# Patient Record
Sex: Male | Born: 1955 | Race: White | Hispanic: No | Marital: Married | Smoking: Never smoker
Health system: Southern US, Community
[De-identification: ages and names within clinical notes are randomized; demographics above are authoritative.]

## PROBLEM LIST (undated history)

## (undated) DIAGNOSIS — I10 Essential (primary) hypertension: Secondary | ICD-10-CM

## (undated) DIAGNOSIS — Z87442 Personal history of urinary calculi: Secondary | ICD-10-CM

## (undated) DIAGNOSIS — E119 Type 2 diabetes mellitus without complications: Secondary | ICD-10-CM

---

## 2018-02-07 ENCOUNTER — Emergency Department (HOSPITAL_COMMUNITY): Payer: 59

## 2018-02-07 ENCOUNTER — Emergency Department (HOSPITAL_COMMUNITY)
Admission: EM | Admit: 2018-02-07 | Discharge: 2018-02-07 | Disposition: A | Discharge status: Home / Self Care | Payer: 59 | Attending: Emergency Medicine | Admitting: Emergency Medicine

## 2018-02-07 ENCOUNTER — Encounter (HOSPITAL_COMMUNITY): Payer: Self-pay | Admitting: Emergency Medicine

## 2018-02-07 DIAGNOSIS — I161 Hypertensive emergency: Secondary | ICD-10-CM | POA: Insufficient documentation

## 2018-02-07 DIAGNOSIS — N23 Unspecified renal colic: Secondary | ICD-10-CM | POA: Diagnosis not present

## 2018-02-07 DIAGNOSIS — E1169 Type 2 diabetes mellitus with other specified complication: Secondary | ICD-10-CM | POA: Diagnosis not present

## 2018-02-07 DIAGNOSIS — E118 Type 2 diabetes mellitus with unspecified complications: Secondary | ICD-10-CM

## 2018-02-07 DIAGNOSIS — R1110 Vomiting, unspecified: Secondary | ICD-10-CM | POA: Insufficient documentation

## 2018-02-07 DIAGNOSIS — I214 Non-ST elevation (NSTEMI) myocardial infarction: Secondary | ICD-10-CM | POA: Insufficient documentation

## 2018-02-07 DIAGNOSIS — M5489 Other dorsalgia: Secondary | ICD-10-CM | POA: Diagnosis present

## 2018-02-07 HISTORY — DX: Essential (primary) hypertension: I10

## 2018-02-07 LAB — TROPONIN I
Troponin I: 0.04 ng/mL (ref <0.03)
Troponin I: 0.04 ng/mL (ref <0.03)

## 2018-02-07 LAB — URINALYSIS, ROUTINE W REFLEX MICROSCOPIC
Bilirubin Urine: NEGATIVE
Glucose, UA: >=500 mg/dL — AB
KETONES UR: 15 mg/dL — AB
Leukocytes, UA: NEGATIVE
Nitrite: NEGATIVE
PH: 5 (ref 5.0–8.0)
Protein, ur: 30 mg/dL — AB
Specific Gravity, Urine: 1.025 (ref 1.005–1.030)

## 2018-02-07 LAB — COMPREHENSIVE METABOLIC PANEL
ALT: 9 U/L (ref 0–44)
ANION GAP: 14 (ref 5–15)
AST: 15 U/L (ref 15–41)
Albumin: 4.3 g/dL (ref 3.5–5.0)
Alkaline Phosphatase: 74 U/L (ref 38–126)
BILIRUBIN TOTAL: 0.8 mg/dL (ref 0.3–1.2)
BUN: 14 mg/dL (ref 8–23)
CO2: 22 mmol/L (ref 22–32)
Calcium: 9.3 mg/dL (ref 8.9–10.3)
Chloride: 104 mmol/L (ref 98–111)
Creatinine, Ser: 1.19 mg/dL (ref 0.61–1.24)
GFR calc Af Amer: >60 mL/min (ref >60)
Glucose, Bld: 451 mg/dL — ABNORMAL HIGH (ref 70–99)
POTASSIUM: 4.5 mmol/L (ref 3.5–5.1)
Sodium: 140 mmol/L (ref 135–145)
TOTAL PROTEIN: 7.7 g/dL (ref 6.5–8.1)

## 2018-02-07 LAB — CBC WITH DIFFERENTIAL/PLATELET
BASOS PCT: 0 %
Basophils Absolute: 0 10*3/uL (ref 0.0–0.1)
Eosinophils Absolute: 0 10*3/uL (ref 0.0–0.7)
Eosinophils Relative: 0 %
HEMATOCRIT: 45.9 % (ref 39.0–52.0)
Hemoglobin: 15.4 g/dL (ref 13.0–17.0)
Lymphocytes Relative: 6 %
Lymphs Abs: 0.6 10*3/uL — ABNORMAL LOW (ref 0.7–4.0)
MCH: 28.6 pg (ref 26.0–34.0)
MCHC: 33.6 g/dL (ref 30.0–36.0)
MCV: 85.3 fL (ref 78.0–100.0)
MONO ABS: 0.3 10*3/uL (ref 0.1–1.0)
MONOS PCT: 3 %
NEUTROS ABS: 9.9 10*3/uL — AB (ref 1.7–7.7)
Neutrophils Relative %: 91 %
Platelets: 258 10*3/uL (ref 150–400)
RBC: 5.38 MIL/uL (ref 4.22–5.81)
RDW: 13.7 % (ref 11.5–15.5)
WBC: 10.8 10*3/uL — AB (ref 4.0–10.5)

## 2018-02-07 LAB — URINALYSIS, MICROSCOPIC (REFLEX): BACTERIA UA: NONE SEEN

## 2018-02-07 LAB — CK TOTAL AND CKMB (NOT AT ARMC)
CK, MB: 3.8 ng/mL (ref 0.5–5.0)
Relative Index: INVALID (ref 0.0–2.5)
Total CK: 61 U/L (ref 49–397)

## 2018-02-07 MED ORDER — ASPIRIN 81 MG PO CHEW
324.0000 mg | CHEWABLE_TABLET | Freq: Once | ORAL | Status: AC
  Administered 2018-02-07: 324 mg via ORAL
  Filled 2018-02-07: qty 4

## 2018-02-07 MED ORDER — TAMSULOSIN HCL 0.4 MG PO CAPS
0.4000 mg | ORAL_CAPSULE | Freq: Once | ORAL | Status: AC
  Administered 2018-02-07: 0.4 mg via ORAL
  Filled 2018-02-07: qty 1

## 2018-02-07 MED ORDER — LABETALOL HCL 100 MG PO TABS
100.0000 mg | ORAL_TABLET | Freq: Once | ORAL | Status: AC
  Administered 2018-02-07: 100 mg via ORAL
  Filled 2018-02-07 (×2): qty 1

## 2018-02-07 MED ORDER — LABETALOL HCL 100 MG PO TABS: 100.0000 mg | ORAL_TABLET | Freq: Two times a day (BID) | ORAL | 0 refills | Status: DC

## 2018-02-07 MED ORDER — METFORMIN HCL 500 MG PO TABS
500.0000 mg | ORAL_TABLET | Freq: Once | ORAL | Status: AC
  Administered 2018-02-07: 500 mg via ORAL
  Filled 2018-02-07: qty 1

## 2018-02-07 MED ORDER — HYDROMORPHONE HCL 1 MG/ML IJ SOLN
1.0000 mg | Freq: Once | INTRAMUSCULAR | Status: AC
  Administered 2018-02-07: 1 mg via INTRAVENOUS
  Filled 2018-02-07: qty 1

## 2018-02-07 MED ORDER — OXYCODONE-ACETAMINOPHEN 5-325 MG PO TABS: 1.0000 | ORAL_TABLET | Freq: Four times a day (QID) | ORAL | 0 refills | Status: DC | PRN

## 2018-02-07 MED ORDER — FENTANYL CITRATE (PF) 100 MCG/2ML IJ SOLN
50.0000 ug | Freq: Once | INTRAMUSCULAR | Status: AC
  Administered 2018-02-07: 50 ug via INTRAVENOUS
  Filled 2018-02-07: qty 2

## 2018-02-07 MED ORDER — LACTATED RINGERS IV BOLUS
1000.0000 mL | Freq: Once | INTRAVENOUS | Status: AC
  Administered 2018-02-07: 1000 mL via INTRAVENOUS

## 2018-02-07 MED ORDER — METFORMIN HCL 500 MG PO TABS: 500.0000 mg | ORAL_TABLET | Freq: Two times a day (BID) | ORAL | 0 refills | Status: DC

## 2018-02-07 MED ORDER — ONDANSETRON HCL 4 MG PO TABS: 4.0000 mg | ORAL_TABLET | Freq: Three times a day (TID) | ORAL | 0 refills | Status: DC | PRN

## 2018-02-07 MED ORDER — SODIUM CHLORIDE 0.9 % IV BOLUS
1000.0000 mL | Freq: Once | INTRAVENOUS | Status: AC
  Administered 2018-02-07: 1000 mL via INTRAVENOUS

## 2018-02-07 MED ORDER — HYDROCODONE-ACETAMINOPHEN 5-325 MG PO TABS
1.0000 | ORAL_TABLET | Freq: Once | ORAL | Status: AC
  Administered 2018-02-07: 1 via ORAL
  Filled 2018-02-07: qty 1

## 2018-02-07 MED ORDER — KETOROLAC TROMETHAMINE 30 MG/ML IJ SOLN
15.0000 mg | Freq: Once | INTRAMUSCULAR | Status: AC
  Administered 2018-02-07: 15 mg via INTRAVENOUS
  Filled 2018-02-07: qty 1

## 2018-02-07 MED ORDER — TAMSULOSIN HCL 0.4 MG PO CAPS: 0.4000 mg | ORAL_CAPSULE | Freq: Every day | ORAL | 0 refills | Status: DC

## 2018-02-07 NOTE — ED Triage Notes (Signed)
Pt c/o back pain that started this morning. Reports vomiting once.

## 2018-02-07 NOTE — ED Provider Notes (Signed)
Emergency Department Provider Note   I have reviewed the triage vital signs and the nursing notes.   HISTORY  Chief Complaint Back Pain and Emesis   HPI Harry Rogers is a 62 y.o. male with history of hypertension the presents the emergency department today with back pain.  Patient had acute onset of left-sided back pain that is unlike anything he is ever had before.  Patient states that it does not really radiate but he has had some dark urine with it as well.  He cannot find a comfortable position.  States it does not get better or worse with movement.  No fevers.  Has has noticed a cough over the last 6 weeks or so that is nonproductive.  Patient knows he has high blood pressure but will not take medication for it.  Has not had trauma. No other associated or modifying symptoms.    Past Medical History:  Diagnosis Date  . Hypertension     There are no active problems to display for this patient.   History reviewed. No pertinent surgical history.    Allergies Patient has no known allergies.  No family history on file.  Social History Social History   Tobacco Use  . Smoking status: Never Smoker  . Smokeless tobacco: Never Used  Substance Use Topics  . Alcohol use: Not on file  . Drug use: Not on file    Review of Systems  All other systems negative except as documented in the HPI. All pertinent positives and negatives as reviewed in the HPI. ____________________________________________   PHYSICAL EXAM:  VITAL SIGNS: ED Triage Vitals  Enc Vitals Group     BP 02/07/18 1021 (!) 174/124     Pulse Rate 02/07/18 1021 98     Resp 02/07/18 1021 (!) 22     Temp --      Temp src --      SpO2 02/07/18 1021 98 %    Constitutional: Alert and oriented. Well appearing and in no acute distress. Eyes: Conjunctivae are normal. PERRL. EOMI. Head: Atraumatic. Nose: No congestion/rhinnorhea. Mouth/Throat: Mucous membranes are moist.  Oropharynx non-erythematous. Neck:  No stridor.  No meningeal signs.   Cardiovascular: Normal rate, regular rhythm. Good peripheral circulation. Grossly normal heart sounds.   Respiratory: Normal respiratory effort.  No retractions. Lungs CTAB. Gastrointestinal: Soft and nontender. No distention.  Musculoskeletal: No lower extremity tenderness nor edema. No gross deformities of extremities. Neurologic:  Normal speech and language. No gross focal neurologic deficits are appreciated.  Skin:  Skin is warm, dry and intact. No rash noted.   ____________________________________________   LABS (all labs ordered are listed, but only abnormal results are displayed)  Labs Reviewed  CBC WITH DIFFERENTIAL/PLATELET - Abnormal; Notable for the following components:      Result Value   WBC 10.8 (*)    Neutro Abs 9.9 (*)    Lymphs Abs 0.6 (*)    All other components within normal limits  URINALYSIS, ROUTINE W REFLEX MICROSCOPIC - Abnormal; Notable for the following components:   Color, Urine BROWN (*)    APPearance CLOUDY (*)    Glucose, UA >=500 (*)    Hgb urine dipstick LARGE (*)    Ketones, ur 15 (*)    Protein, ur 30 (*)    All other components within normal limits  COMPREHENSIVE METABOLIC PANEL - Abnormal; Notable for the following components:   Glucose, Bld 451 (*)    All other components within normal limits  TROPONIN  I - Abnormal; Notable for the following components:   Troponin I 0.04 (*)    All other components within normal limits  TROPONIN I - Abnormal; Notable for the following components:   Troponin I 0.04 (*)    All other components within normal limits  URINE CULTURE  URINALYSIS, MICROSCOPIC (REFLEX)  TROPONIN I  TROPONIN I  CK TOTAL AND CKMB ([facility])   ____________________________________________  EKG   EKG Interpretation  Date/Time:  Monday February 07 2018 11:36:39 EDT Ventricular Rate:  103 PR Interval:    QRS Duration: 118 QT Interval:  322 QTC Calculation: 422 R Axis:   -101 Text  Interpretation:  Sinus tachycardia Probable left atrial enlargement LAD, consider left anterior fascicular block Borderline low voltage, extremity leads Abnormal T, consider ischemia, lateral leads No old tracing to compare Confirmed by Marily MemosMesner, Kasondra Junod [phone]([id number]) on 02/07/2018 12:21:00 PM       ____________________________________________  RADIOLOGY  Dg Chest 2 View  Result Date: 02/07/2018 CLINICAL DATA:  Confusion, weakness, flank pain EXAM: CHEST - 2 VIEW COMPARISON:  None FINDINGS: Shallow lung inflation. Heart size is normal. Lungs are clear. No pulmonary edema. IMPRESSION: No active cardiopulmonary disease. Electronically Signed   By: Norva PavlovElizabeth  Brown M.D.   On: 02/07/2018 12:37   Ct Renal Stone Study  Result Date: 02/07/2018 CLINICAL DATA:  Back pain and vomiting EXAM: CT ABDOMEN AND PELVIS WITHOUT CONTRAST TECHNIQUE: Multidetector CT imaging of the abdomen and pelvis was performed following the standard protocol without IV contrast. COMPARISON:  None. FINDINGS: Lower chest: No acute abnormality. Hepatobiliary: No focal liver abnormality is seen. No gallstones, gallbladder wall thickening, or biliary dilatation. Pancreas: Unremarkable. No pancreatic ductal dilatation or surrounding inflammatory changes. Spleen: Normal in size without focal abnormality. Adrenals/Urinary Tract: Adrenal glands are within normal limits. The right kidney demonstrates scattered small nonobstructing renal stones. The right ureter and collecting system are within normal limits. On the left there are multiple left renal calculi identified both within the calyceal system as well as within the renal pelvis. The largest of these in the left renal pelvis measures 13 mm in dimension. There are hydronephrotic changes identified which extend into the left ureter and to the level of the left UVJ. At the left ureterovesical junction there is a 5 mm obstructing stone identified. The bladder is partially distended. Stomach/Bowel:  Diverticular change of the colon is noted without definitive diverticulitis. The appendix is within normal limits. No small bowel dilatation is seen. The stomach is within normal limits. Vascular/Lymphatic: Aortic atherosclerosis. No enlarged abdominal or pelvic lymph nodes. Reproductive: Prostate is unremarkable. Other: No abdominal wall hernia or abnormality. No abdominopelvic ascites. Musculoskeletal: Degenerative changes of lumbar spine. IMPRESSION: Distal left ureteral stone with obstructive change. Bilateral renal calculi are noted left greater than right as described above. Diverticulosis without diverticulitis. Electronically Signed   By: Alcide CleverMark  Lukens M.D.   On: 02/07/2018 12:26    ____________________________________________   PROCEDURES  Procedure(s) performed:   Procedures  CRITICAL CARE Performed by: Marily MemosJason Brielynn Sekula Total critical care time: 35 minutes Critical care time was exclusive of separately billable procedures and treating other patients. Critical care was necessary to treat or prevent imminent or life-threatening deterioration. Critical care was time spent personally by me on the following activities: development of treatment plan with patient and/or surrogate as well as nursing, discussions with consultants, evaluation of patient's response to treatment, examination of patient, obtaining history from patient or surrogate, ordering and performing treatments and interventions, ordering and review  of laboratory studies, ordering and review of radiographic studies, pulse oximetry and re-evaluation of patient's condition.  ____________________________________________   INITIAL IMPRESSION / ASSESSMENT AND PLAN / ED COURSE  Kidney stone versus pyelonephritis versus possible cardiac/pulmonary etiology.  Will work-up for same.  Pain medication fluids for now.  Patient found to be persistently hypertensive, tachycardic, tachypneic also found to have a kidney stone and elevated  troponin with abnormal EKG.  Discussed admitted to the hospital to get better blood pressure control and pain control and make sure that his cardiac enzymes are normal have cardiology see him however patient refused.  Patient is alert, oriented to person place time and situation.  He understands the risks of going home without definitively ensuring that this is not a primary cardiac problem however he still refuses.  He agrees to take some blood pressure medication and diabetes medications at home along with symptom medic treatment for his kidney stone.  Patient needs PCP follow-up but can return here for any change in mind or change in symptoms.  At the time of discharge patient was not necessarily stable but leaving AGAINST MEDICAL ADVICE.  Pertinent labs & imaging results that were available during my care of the patient were reviewed by me and considered in my medical decision making (see chart for details).  ____________________________________________  FINAL CLINICAL IMPRESSION(S) / ED DIAGNOSES  Final diagnoses:  Renal colic  Hypertensive emergency  NSTEMI (non-ST elevated myocardial infarction) (HCC)  Type 2 diabetes mellitus with complication, without long-term current use of insulin (HCC)     MEDICATIONS GIVEN DURING THIS VISIT:  Medications  metFORMIN (GLUCOPHAGE) tablet 500 mg (has no administration in time range)  lactated ringers bolus 1,000 mL (0 mLs Intravenous Stopped 02/07/18 1320)  fentaNYL (SUBLIMAZE) injection 50 mcg (50 mcg Intravenous Given 02/07/18 1129)  tamsulosin (FLOMAX) capsule 0.4 mg (0.4 mg Oral Given 02/07/18 1316)  ketorolac (TORADOL) 30 MG/ML injection 15 mg (15 mg Intravenous Given 02/07/18 1316)  HYDROcodone-acetaminophen (NORCO/VICODIN) 5-325 MG per tablet 1 tablet (1 tablet Oral Given 02/07/18 1316)  HYDROmorphone (DILAUDID) injection 1 mg (1 mg Intravenous Given 02/07/18 1424)  sodium chloride 0.9 % bolus 1,000 mL (1,000 mLs Intravenous New Bag/Given  02/07/18 1423)  labetalol (NORMODYNE) tablet 100 mg (100 mg Oral Given 02/07/18 1514)  aspirin chewable tablet 324 mg (324 mg Oral Given 02/07/18 1513)     NEW OUTPATIENT MEDICATIONS STARTED DURING THIS VISIT:  New Prescriptions   LABETALOL (NORMODYNE) 100 MG TABLET    Take 1 tablet (100 mg total) by mouth 2 (two) times daily.   METFORMIN (GLUCOPHAGE) 500 MG TABLET    Take 1 tablet (500 mg total) by mouth 2 (two) times daily with a meal.   ONDANSETRON (ZOFRAN) 4 MG TABLET    Take 1 tablet (4 mg total) by mouth every 8 (eight) hours as needed for nausea or vomiting.   OXYCODONE-ACETAMINOPHEN (PERCOCET) 5-325 MG TABLET    Take 1-2 tablets by mouth every 6 (six) hours as needed for severe pain.   TAMSULOSIN (FLOMAX) 0.4 MG CAPS CAPSULE    Take 1 capsule (0.4 mg total) by mouth daily.    Note:  This note was prepared with assistance of Dragon voice recognition software. Occasional wrong-word or sound-a-like substitutions may have occurred due to the inherent limitations of voice recognition software.   Marily Memos, MD 02/07/18 [phone]

## 2018-02-07 NOTE — ED Notes (Signed)
Date and time results received: 02/07/18 1328  Test: Troponin  Critical Value: 0.04  Name of Provider Notified: Dr. Desiree LucyJ. Messner   Orders Received? Or Actions Taken?: Will continue to monitor and await for new orders.

## 2018-02-07 NOTE — ED Notes (Signed)
Date and time results received: 02/07/18 16:03  Test: Troponin  Critical Value: 0.04  Name of Provider Notified: Dr. Carolynn ServeJ. Mesner   Orders Received? Or Actions Taken?: Will continue to monitor and await any new orders.

## 2018-02-07 NOTE — ED Notes (Signed)
Bed: WA07 Expected date:  Expected time:  Means of arrival:  Comments: 

## 2018-02-08 LAB — URINE CULTURE: Culture: NO GROWTH

## 2018-02-23 ENCOUNTER — Ambulatory Visit (INDEPENDENT_AMBULATORY_CARE_PROVIDER_SITE_OTHER): Payer: Managed Care, Other (non HMO) | Admitting: Family Medicine

## 2018-02-23 ENCOUNTER — Encounter: Payer: Self-pay | Admitting: Family Medicine

## 2018-02-23 ENCOUNTER — Other Ambulatory Visit: Payer: Self-pay

## 2018-02-23 VITALS — BP 162/90 | HR 87 | Temp 98.1°F | Ht 64.0 in | Wt 188.0 lb

## 2018-02-23 DIAGNOSIS — R778 Other specified abnormalities of plasma proteins: Secondary | ICD-10-CM

## 2018-02-23 DIAGNOSIS — R739 Hyperglycemia, unspecified: Secondary | ICD-10-CM

## 2018-02-23 DIAGNOSIS — Z91199 Patient's noncompliance with other medical treatment and regimen due to unspecified reason: Secondary | ICD-10-CM

## 2018-02-23 DIAGNOSIS — Z9119 Patient's noncompliance with other medical treatment and regimen: Secondary | ICD-10-CM

## 2018-02-23 DIAGNOSIS — I10 Essential (primary) hypertension: Secondary | ICD-10-CM | POA: Diagnosis not present

## 2018-02-23 DIAGNOSIS — N200 Calculus of kidney: Secondary | ICD-10-CM

## 2018-02-23 DIAGNOSIS — R7989 Other specified abnormal findings of blood chemistry: Secondary | ICD-10-CM | POA: Diagnosis not present

## 2018-02-23 DIAGNOSIS — Z13220 Encounter for screening for lipoid disorders: Secondary | ICD-10-CM

## 2018-02-23 DIAGNOSIS — Z23 Encounter for immunization: Secondary | ICD-10-CM

## 2018-02-23 MED ORDER — LABETALOL HCL 100 MG PO TABS: 100.0000 mg | ORAL_TABLET | Freq: Two times a day (BID) | ORAL | 1 refills | Status: DC

## 2018-02-23 MED ORDER — LISINOPRIL 10 MG PO TABS: 10.0000 mg | ORAL_TABLET | Freq: Every day | ORAL | 1 refills | Status: DC

## 2018-02-23 MED ORDER — METFORMIN HCL 500 MG PO TABS: 500.0000 mg | ORAL_TABLET | Freq: Two times a day (BID) | ORAL | 1 refills | Status: DC

## 2018-02-23 NOTE — Progress Notes (Signed)
Subjective:  By signing my name below, I, Essence Howell, attest that this documentation has been prepared under the direction and in the presence of Wendie Agreste, MD Electronically Signed: Ladene Artist, ED Scribe 02/23/2018 at 3:37 PM.   Patient ID: Paris Lore, male    DOB: 03/24/1956, 61 y.o.   MRN: [id number]  Chief Complaint  Patient presents with  . Establish Care    Blood sugar and get to know each other    HPI  Octavius Shin "[name]" is a 62 y.o. male who presents to Primary Care at [facility] to establish care. New pt. H/o DM, HTN, gout, psoriasis. Pt is fasting at this time.  Gout Seen at [facility] rheumatology. Prev on allopurinol 200 mg qd. Plan for 300 mg allopurinol based on note from 12/27/13. Denies alcohol use. Reports occasional marijuana use but no other illicit drug use.  HTN Seen 9/23 in ER, noted HTN at 174/124 but wasn't taking meds for BP. Tachycardiac, HTN and tachypneic, elevated troponin at 0.04 with abnormal EKG. Recommended hospital admission and cardiology eval but he refused and left AMA. Treated with labetalol 100 mg bid. Glucose uncontrolled with reading of 451 on 9/23, started metformin 500 mg bid, creatinine 1.19. Treated for kidney stone at that time. CT renal stone study with 5 mm stone in distal ureter and L renal calculi barges 1.3 cm in renal pelvis on L. - Pt states that he has been feeling fine overall since ER visit. He has never seen an urologist or general physical in ~10 yrs. States he does not recall actually seeing the kidney stone but feels like he passed it. Pain and hematuria have improved and pt does not recall if he is still taking Flomax. He reports that he promised that he would do f/u so they agreed to let him leave. He has noticed feeling lethargic which he thinks is due to BP meds. States he has taken Losartan in the past but stopped taking meds when he ran out of refills. Denies cp. Pt has never seen cardiology or had a stress test. No known  drug allergies.  DM Pt reports that he was diagnosed with DM in the ER. He has been compliant with Metformin; denies freq urination, polydipsia, blurred vision, any side-effects from meds. Wife states that he has been eating healthier since ER visit.  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  There are no active problems to display for this patient.  Past Medical History:  Diagnosis Date  . Hypertension    No past surgical history on file. No Known Allergies Prior to Admission medications   Medication Sig Start Date End Date Taking? Authorizing Provider  acetaminophen (TYLENOL) 500 MG tablet Take 1,000 mg by mouth 2 (two) times daily as needed for moderate pain.    [provider]  DM-Doxylamine-Acetaminophen 30-12.09-998 MG/30ML LIQD Take 30 mLs by mouth daily as needed (sleep).    [provider]  hydrocortisone cream 1 % Apply 1 application topically daily as needed for itching (psoriasis).    [provider]  ibuprofen (ADVIL,MOTRIN) 200 MG tablet Take 400 mg by mouth every 4 (four) hours as needed for moderate pain.    [provider]  labetalol (NORMODYNE) 100 MG tablet Take 1 tablet (100 mg total) by mouth 2 (two) times daily. 02/07/18   Mesner, Corene Cornea, MD  metFORMIN (GLUCOPHAGE) 500 MG tablet Take 1 tablet (500 mg total) by mouth 2 (two)  times daily with a meal. 02/07/18   Mesner, Corene Cornea, MD  ondansetron (ZOFRAN) 4 MG tablet Take 1 tablet (4 mg total) by mouth every 8 (eight) hours as needed for nausea or vomiting. 02/07/18   Mesner, Corene Cornea, MD  oxyCODONE-acetaminophen (PERCOCET) 5-325 MG tablet Take 1-2 tablets by mouth every 6 (six) hours as needed for severe pain. 02/07/18   Mesner, Corene Cornea, MD  tamsulosin (FLOMAX) 0.4 MG CAPS capsule Take 1 capsule (0.4 mg total) by mouth daily. 02/07/18   Mesner, Corene Cornea, MD   Social History   Socioeconomic History  . Marital status: Married    Spouse name: Not on file  .  Number of children: Not on file  . Years of education: Not on file  . Highest education level: Not on file  Occupational History  . Not on file  Social Needs  . Financial resource strain: Not on file  . Food insecurity:    Worry: Not on file    Inability: Not on file  . Transportation needs:    Medical: Not on file    Non-medical: Not on file  Tobacco Use  . Smoking status: Never Smoker  . Smokeless tobacco: Never Used  Substance and Sexual Activity  . Alcohol use: Not on file  . Drug use: Not on file  . Sexual activity: Not on file  Lifestyle  . Physical activity:    Days per week: Not on file    Minutes per session: Not on file  . Stress: Not on file  Relationships  . Social connections:    Talks on phone: Not on file    Gets together: Not on file    Attends religious service: Not on file    Active member of club or organization: Not on file    Attends meetings of clubs or organizations: Not on file    Relationship status: Not on file  . Intimate partner violence:    Fear of current or ex partner: Not on file    Emotionally abused: Not on file    Physically abused: Not on file    Forced sexual activity: Not on file  Other Topics Concern  . Not on file  Social History Narrative  . Not on file   Review of Systems  Constitutional: Positive for fatigue.  Eyes: Negative for visual disturbance.  Cardiovascular: Negative for chest pain.  Gastrointestinal: Negative for abdominal pain (resolved).  Endocrine: Negative for polydipsia.  Genitourinary: Negative for flank pain, frequency and hematuria.      Objective:   Physical Exam  Constitutional: He is oriented to person, place, and time. He appears well-developed and well-nourished.  HENT:  Head: Normocephalic and atraumatic.  Eyes: Pupils are equal, round, and reactive to light. EOM are normal.  Neck: No JVD present. Carotid bruit is not present.  Cardiovascular: Normal rate, regular rhythm and normal heart  sounds.  No murmur heard. Pulmonary/Chest: Effort normal and breath sounds normal. He has no rales.  Musculoskeletal: He exhibits no edema.  Neurological: He is alert and oriented to person, place, and time.  Skin: Skin is warm and dry.  Psychiatric: He has a normal mood and affect.  Vitals reviewed.  Vitals:   02/23/18 1515 02/23/18 1518  BP: (!) 172/106 (!) 162/90  Pulse: 87   Temp: 98.1 F (36.7 C)   TempSrc: Oral   SpO2: 99%   Weight: 188 lb (85.3 kg)   Height: 5' 4"  (1.626 m)       Assessment &  Plan:    Sade Mehlhoff is a 62 y.o. male Essential hypertension - Plan: Ambulatory referral to Cardiology, Comprehensive metabolic panel, Lipid panel, Microalbumin/Creatinine Ratio, Urine, lisinopril (PRINIVIL,ZESTRIL) 10 MG tablet, labetalol (NORMODYNE) 100 MG tablet Elevated troponin - Plan: Ambulatory referral to Cardiology History of nonadherence to medical treatment  -Importance of medication adherence discussed as well as risks of nonadherence discussed.  -Continue labetalol, add lisinopril 10 mg daily.  -Elevated troponin may have been potential strain pattern, asymptomatic at present other than some fatigue.  Refer to cardiology for ongoing follow-up and decision on further testing/stress testing.  -ER/911 precautions given for any chest pain or worsening symptoms  Screening for hyperlipidemia   - check lipid panel.   Hyperglycemia - Plan: Hemoglobin A1c, Comprehensive metabolic panel, Microalbumin/Creatinine Ratio, Urine, metFORMIN (GLUCOPHAGE) 500 MG tablet  -Diabetes level of hyperglycemia in the hospital, check A1c, urine micro/creatinine, CMP, start metformin 500 mg twice daily.  Nephrolithiasis - Plan: Ambulatory referral to Urology  -No current symptoms, possibly past previous nephrolith.  Other nephroliths noted, refer to urology for evaluation and ongoing care.  Need for immunization against influenza - Plan: Flu Vaccine QUAD 36+ mos IM  Recheck next few weeks  to review labs and decide next step in treatment  Meds ordered this encounter  Medications  . lisinopril (PRINIVIL,ZESTRIL) 10 MG tablet    Sig: Take 1 tablet (10 mg total) by mouth daily.    Dispense:  90 tablet    Refill:  1  . labetalol (NORMODYNE) 100 MG tablet    Sig: Take 1 tablet (100 mg total) by mouth 2 (two) times daily.    Dispense:  60 tablet    Refill:  1  . metFORMIN (GLUCOPHAGE) 500 MG tablet    Sig: Take 1 tablet (500 mg total) by mouth 2 (two) times daily with a meal.    Dispense:  60 tablet    Refill:  1   Patient Instructions   If no more acute symptoms of kidney stone, you can stop the tamsulosin.  I will refer you to urology to discuss other kidney stones. Return to the clinic or go to the nearest emergency room if any of your symptoms worsen or new symptoms occur.  I will also refer you to cardiology due to prior elevated troponin when in the emergency room.for blood pressure, continue labetalol, but I would also recommend starting additional med. Lisinopril 53m per day for now. If any chest pains, call 911/go to the emergency room.  Blood sugar high in hospital at diabetes level. Continue metformin twice per day, I will check A1c to decide on other treatment, and follow up in next few weeks to discuss further.   Type 2 Diabetes Mellitus, Self Care, Adult When you have type 2 diabetes (type 2 diabetes mellitus), you must keep your blood sugar (glucose) under control. You can do this with:  Nutrition.  Exercise.  Lifestyle changes.  Medicines or insulin, if needed.  Support from your doctors and others.  How do I manage my blood sugar?  Check your blood sugar level every day, as often as told.  Call your doctor if your blood sugar is above your goal numbers for 2 tests in a row.  Have your A1c (hemoglobin A1c) level checked at least two times a year. Have it checked more often if your doctor tells you to. Your doctor will set treatment goals for  you. Generally, you should have these blood sugar levels:  Before meals (preprandial):  80-130 mg/dL (4.4-7.2 mmol/L).  After meals (postprandial): lower than 180 mg/dL (10 mmol/L).  A1c level: less than 7%.  What do I need to know about high blood sugar? High blood sugar is called hyperglycemia. Know the signs of high blood sugar. Signs may include:  Feeling: ? Thirsty. ? Hungry. ? Very tired.  Needing to pee (urinate) more than usual.  Blurry vision.  What do I need to know about low blood sugar? Low blood sugar is called hypoglycemia. This is when blood sugar is at or below 70 mg/dL (3.9 mmol/L). Symptoms may include:  Feeling: ? Hungry. ? Worried or nervous (anxious). ? Sweaty and clammy. ? Confused. ? Dizzy. ? Sleepy. ? Sick to your stomach (nauseous).  Having: ? A fast heartbeat (palpitations). ? A headache. ? A change in your vision. ? Jerky movements that you cannot control (seizure). ? Nightmares. ? Tingling or no feeling (numbness) around the mouth, lips, or tongue.  Having trouble with: ? Talking. ? Paying attention (concentrating). ? Moving (coordination). ? Sleeping.  Shaking.  Passing out (fainting).  Getting upset easily (irritability).  Treating low blood sugar  To treat low blood sugar, eat or drink something sugary right away. If you can think clearly and swallow safely, follow the 15:15 rule:  Take 15 grams of a fast-acting carb (carbohydrate). Some fast-acting carbs are: ? 1 tube of glucose gel. ? 3 sugar tablets (glucose pills). ? 6-8 pieces of hard candy. ? 4 oz (120 mL) of fruit juice. ? 4 oz (120 mL) regular (not diet) soda.  Check your blood sugar 15 minutes after you take the carb.  If your blood sugar is still at or below 70 mg/dL (3.9 mmol/L), take 15 grams of a carb again.  If your blood sugar does not go above 70 mg/dL (3.9 mmol/L) after 3 tries, get help right away.  After your blood sugar goes back to normal, eat a  meal or a snack within 1 hour.  Treating very low blood sugar If your blood sugar is at or below 54 mg/dL (3 mmol/L), you have very low blood sugar (severe hypoglycemia). This is an emergency. Do not wait to see if the symptoms will go away. Get medical help right away. Call your local emergency services (911 in the [place] Do not drive yourself to the hospital. If you have very low blood sugar and you cannot eat or drink, you may need a glucagon shot (injection). A family member or friend should learn how to check your blood sugar and how to give you a glucagon shot. Ask your doctor if you need to have a glucagon shot kit at home. What else is important to manage my diabetes? Medicine Follow these instructions about insulin and diabetes medicines:  Take them as told by your doctor.  Adjust them as told by your doctor.  Do not run out of them.  Having diabetes can raise your risk for other long-term conditions. These include heart or kidney disease. Your doctor may prescribe medicines to help prevent problems from diabetes. Food   Make healthy food choices. These include: ? Chicken, fish, egg whites, and beans. ? Oats, whole wheat, bulgur, brown rice, quinoa, and millet. ? Fresh fruits and vegetables. ? Low-fat dairy products. ? Nuts, avocado, olive oil, and canola oil.  Make a food plan with a specialist (dietitian).  Follow instructions from your doctor about what you cannot eat or drink.  Drink enough fluid to keep your pee (urine) clear or  pale yellow.  Eat healthy snacks between healthy meals.  Keep track of carbs that you eat. Read food labels. Learn food serving sizes.  Follow your sick day plan when you cannot eat or drink normally. Make this plan with your doctor so it is ready to use. Activity  Exercise at least 3 times a week.  Do not go more than 2 days without exercising.  Talk with your doctor before you start a new exercise. Your doctor may need to adjust your  insulin, medicines, or food. Lifestyle   Do not use any tobacco products. These include cigarettes, chewing tobacco, and e-cigarettes.If you need help quitting, ask your doctor.  Ask your doctor how much alcohol is safe for you.  Learn to deal with stress. If you need help with this, ask your doctor. Body care  Stay up to date with your shots (immunizations).  Have your eyes and feet checked by a doctor as often as told.  Check your skin and feet every day. Check for cuts, bruises, redness, blisters, or sores.  Brush your teeth and gums two times a day.  Floss at least one time a day.  Go to the dentist least one time every 6 months.  Stay at a healthy weight. General instructions   Take over-the-counter and prescription medicines only as told by your doctor.  Share your diabetes care plan with: ? Your work or school. ? People you live with.  Check your pee (urine) for ketones: ? When you are sick. ? As told by your doctor.  Carry a card or wear jewelry that says that you have diabetes.  Ask your doctor: ? Do I need to meet with a diabetes educator? ? Where can I find a support group for people with diabetes?  Keep all follow-up visits as told by your doctor. This is important. Where to find more information: To learn more about diabetes, visit:  American Diabetes Association: www.diabetes.org  American Association of Diabetes Educators: www.diabeteseducator.org/patient-resources  This information is not intended to replace advice given to you by your health care provider. Make sure you discuss any questions you have with your health care provider. Document Released: 08/26/2015 Document Revised: 10/10/2015 Document Reviewed: 06/07/2015 Elsevier Interactive Patient Education  [employer].  If you have lab work done today you will be contacted with your lab results within the next 2 weeks.  If you have not heard from [place] then please contact us. The fastest  way to get your results is to register for My Chart.   IF you received an x-ray today, you will receive an invoice from [facility]. Please contact [facility] at [phone] with questions or concerns regarding your invoice.   IF you received labwork today, you will receive an invoice from [facility]. Please contact [facility] at [phone] with questions or concerns regarding your invoice.   Our billing staff will not be able to assist you with questions regarding bills from these companies.  You will be contacted with the lab results as soon as they are available. The fastest way to get your results is to activate your My Chart account. Instructions are located on the last page of this paperwork. If you have not heard from [place] regarding the results in 2 weeks, please contact this office.       I personally performed the services described in this documentation, which was scribed in my presence. The recorded information has been reviewed and considered for accuracy and completeness, addended by  me as needed, and agree with information above.  Signed,   Merri Ray, MD [facility] at [facility].  02/27/18 4:38 PM

## 2018-02-23 NOTE — Patient Instructions (Addendum)
If no more acute symptoms of kidney stone, you can stop the tamsulosin.  I will refer you to urology to discuss other kidney stones. Return to the clinic or go to the nearest emergency room if any of your symptoms worsen or new symptoms occur.  I will also refer you to cardiology due to prior elevated troponin when in the emergency room.for blood pressure, continue labetalol, but I would also recommend starting additional med. Lisinopril 28m per day for now. If any chest pains, call 911/go to the emergency room.  Blood sugar high in hospital at diabetes level. Continue metformin twice per day, I will check A1c to decide on other treatment, and follow up in next few weeks to discuss further.   Type 2 Diabetes Mellitus, Self Care, Adult When you have type 2 diabetes (type 2 diabetes mellitus), you must keep your blood sugar (glucose) under control. You can do this with:  Nutrition.  Exercise.  Lifestyle changes.  Medicines or insulin, if needed.  Support from your doctors and others.  How do I manage my blood sugar?  Check your blood sugar level every day, as often as told.  Call your doctor if your blood sugar is above your goal numbers for 2 tests in a row.  Have your A1c (hemoglobin A1c) level checked at least two times a year. Have it checked more often if your doctor tells you to. Your doctor will set treatment goals for you. Generally, you should have these blood sugar levels:  Before meals (preprandial): 80-130 mg/dL (4.4-7.2 mmol/L).  After meals (postprandial): lower than 180 mg/dL (10 mmol/L).  A1c level: less than 7%.  What do I need to know about high blood sugar? High blood sugar is called hyperglycemia. Know the signs of high blood sugar. Signs may include:  Feeling: ? Thirsty. ? Hungry. ? Very tired.  Needing to pee (urinate) more than usual.  Blurry vision.  What do I need to know about low blood sugar? Low blood sugar is called hypoglycemia. This is  when blood sugar is at or below 70 mg/dL (3.9 mmol/L). Symptoms may include:  Feeling: ? Hungry. ? Worried or nervous (anxious). ? Sweaty and clammy. ? Confused. ? Dizzy. ? Sleepy. ? Sick to your stomach (nauseous).  Having: ? A fast heartbeat (palpitations). ? A headache. ? A change in your vision. ? Jerky movements that you cannot control (seizure). ? Nightmares. ? Tingling or no feeling (numbness) around the mouth, lips, or tongue.  Having trouble with: ? Talking. ? Paying attention (concentrating). ? Moving (coordination). ? Sleeping.  Shaking.  Passing out (fainting).  Getting upset easily (irritability).  Treating low blood sugar  To treat low blood sugar, eat or drink something sugary right away. If you can think clearly and swallow safely, follow the 15:15 rule:  Take 15 grams of a fast-acting carb (carbohydrate). Some fast-acting carbs are: ? 1 tube of glucose gel. ? 3 sugar tablets (glucose pills). ? 6-8 pieces of hard candy. ? 4 oz (120 mL) of fruit juice. ? 4 oz (120 mL) regular (not diet) soda.  Check your blood sugar 15 minutes after you take the carb.  If your blood sugar is still at or below 70 mg/dL (3.9 mmol/L), take 15 grams of a carb again.  If your blood sugar does not go above 70 mg/dL (3.9 mmol/L) after 3 tries, get help right away.  After your blood sugar goes back to normal, eat a meal or a snack within 1  hour.  Treating very low blood sugar If your blood sugar is at or below 54 mg/dL (3 mmol/L), you have very low blood sugar (severe hypoglycemia). This is an emergency. Do not wait to see if the symptoms will go away. Get medical help right away. Call your local emergency services (911 in the [place] Do not drive yourself to the hospital. If you have very low blood sugar and you cannot eat or drink, you may need a glucagon shot (injection). A family member or friend should learn how to check your blood sugar and how to give you a glucagon  shot. Ask your doctor if you need to have a glucagon shot kit at home. What else is important to manage my diabetes? Medicine Follow these instructions about insulin and diabetes medicines:  Take them as told by your doctor.  Adjust them as told by your doctor.  Do not run out of them.  Having diabetes can raise your risk for other long-term conditions. These include heart or kidney disease. Your doctor may prescribe medicines to help prevent problems from diabetes. Food   Make healthy food choices. These include: ? Chicken, fish, egg whites, and beans. ? Oats, whole wheat, bulgur, brown rice, quinoa, and millet. ? Fresh fruits and vegetables. ? Low-fat dairy products. ? Nuts, avocado, olive oil, and canola oil.  Make a food plan with a specialist (dietitian).  Follow instructions from your doctor about what you cannot eat or drink.  Drink enough fluid to keep your pee (urine) clear or pale yellow.  Eat healthy snacks between healthy meals.  Keep track of carbs that you eat. Read food labels. Learn food serving sizes.  Follow your sick day plan when you cannot eat or drink normally. Make this plan with your doctor so it is ready to use. Activity  Exercise at least 3 times a week.  Do not go more than 2 days without exercising.  Talk with your doctor before you start a new exercise. Your doctor may need to adjust your insulin, medicines, or food. Lifestyle   Do not use any tobacco products. These include cigarettes, chewing tobacco, and e-cigarettes.If you need help quitting, ask your doctor.  Ask your doctor how much alcohol is safe for you.  Learn to deal with stress. If you need help with this, ask your doctor. Body care  Stay up to date with your shots (immunizations).  Have your eyes and feet checked by a doctor as often as told.  Check your skin and feet every day. Check for cuts, bruises, redness, blisters, or sores.  Brush your teeth and gums two times a  day.  Floss at least one time a day.  Go to the dentist least one time every 6 months.  Stay at a healthy weight. General instructions   Take over-the-counter and prescription medicines only as told by your doctor.  Share your diabetes care plan with: ? Your work or school. ? People you live with.  Check your pee (urine) for ketones: ? When you are sick. ? As told by your doctor.  Carry a card or wear jewelry that says that you have diabetes.  Ask your doctor: ? Do I need to meet with a diabetes educator? ? Where can I find a support group for people with diabetes?  Keep all follow-up visits as told by your doctor. This is important. Where to find more information: To learn more about diabetes, visit:  American Diabetes Association: www.diabetes.org  American Association  of Diabetes Educators: www.diabeteseducator.org/patient-resources  This information is not intended to replace advice given to you by your health care provider. Make sure you discuss any questions you have with your health care provider. Document Released: 08/26/2015 Document Revised: 10/10/2015 Document Reviewed: 06/07/2015 Elsevier Interactive Patient Education  [employer].  If you have lab work done today you will be contacted with your lab results within the next 2 weeks.  If you have not heard from [place] then please contact us. The fastest way to get your results is to register for My Chart.   IF you received an x-ray today, you will receive an invoice from [facility]. Please contact [facility] at [phone] with questions or concerns regarding your invoice.   IF you received labwork today, you will receive an invoice from [facility]. Please contact [facility] at [phone] with questions or concerns regarding your invoice.   Our billing staff will not be able to assist you with questions regarding bills from these companies.  You will be contacted with the lab results as  soon as they are available. The fastest way to get your results is to activate your My Chart account. Instructions are located on the last page of this paperwork. If you have not heard from [place] regarding the results in 2 weeks, please contact this office.

## 2018-02-24 ENCOUNTER — Other Ambulatory Visit: Payer: Self-pay | Admitting: Family Medicine

## 2018-02-24 DIAGNOSIS — E878 Other disorders of electrolyte and fluid balance, not elsewhere classified: Secondary | ICD-10-CM

## 2018-02-24 LAB — COMPREHENSIVE METABOLIC PANEL
A/G RATIO: 1.8 (ref 1.2–2.2)
ALT: 8 IU/L (ref 0–44)
AST: 16 IU/L (ref 0–40)
Albumin: 4.8 g/dL (ref 3.6–4.8)
Alkaline Phosphatase: 68 IU/L (ref 39–117)
BUN/Creatinine Ratio: 15 (ref 10–24)
BUN: 13 mg/dL (ref 8–27)
Bilirubin Total: 0.4 mg/dL (ref 0.0–1.2)
CHLORIDE: 108 mmol/L — AB (ref 96–106)
CO2: 13 mmol/L — CL (ref 20–29)
Calcium: 10.4 mg/dL — ABNORMAL HIGH (ref 8.6–10.2)
Creatinine, Ser: 0.86 mg/dL (ref 0.76–1.27)
GFR calc Af Amer: 107 mL/min/{1.73_m2} (ref >59)
GFR, EST NON AFRICAN AMERICAN: 92 mL/min/{1.73_m2} (ref >59)
Globulin, Total: 2.7 g/dL (ref 1.5–4.5)
Glucose: 124 mg/dL — ABNORMAL HIGH (ref 65–99)
POTASSIUM: 5 mmol/L (ref 3.5–5.2)
Sodium: 147 mmol/L — ABNORMAL HIGH (ref 134–144)
Total Protein: 7.5 g/dL (ref 6.0–8.5)

## 2018-02-24 LAB — HEMOGLOBIN A1C
Est. average glucose Bld gHb Est-mCnc: 214 mg/dL
Hgb A1c MFr Bld: 9.1 % — ABNORMAL HIGH (ref 4.8–5.6)

## 2018-02-24 LAB — LIPID PANEL
CHOL/HDL RATIO: 4.8 ratio (ref 0.0–5.0)
Cholesterol, Total: 197 mg/dL (ref 100–199)
HDL: 41 mg/dL (ref >39)
LDL Calculated: 126 mg/dL — ABNORMAL HIGH (ref 0–99)
TRIGLYCERIDES: 150 mg/dL — AB (ref 0–149)
VLDL CHOLESTEROL CAL: 30 mg/dL (ref 5–40)

## 2018-02-24 LAB — MICROALBUMIN / CREATININE URINE RATIO
Creatinine, Urine: 92.7 mg/dL
Microalb/Creat Ratio: 38 mg/g creat — ABNORMAL HIGH (ref 0.0–30.0)
Microalbumin, Urine: 35.2 ug/mL

## 2018-02-24 NOTE — Progress Notes (Signed)
Bmp ordered

## 2018-03-23 ENCOUNTER — Other Ambulatory Visit: Payer: Self-pay | Admitting: Urology

## 2018-03-23 ENCOUNTER — Encounter (HOSPITAL_COMMUNITY): Payer: Self-pay | Admitting: General Practice

## 2018-03-24 ENCOUNTER — Ambulatory Visit (HOSPITAL_COMMUNITY)
Admission: RE | Admit: 2018-03-24 | Discharge: 2018-03-24 | Disposition: A | Discharge status: Home / Self Care | Payer: Managed Care, Other (non HMO) | Source: Ambulatory Visit | Attending: Urology | Admitting: Urology

## 2018-03-24 ENCOUNTER — Encounter (HOSPITAL_COMMUNITY): Payer: Self-pay | Admitting: General Practice

## 2018-03-24 ENCOUNTER — Encounter (HOSPITAL_COMMUNITY)
Admission: RE | Disposition: A | Discharge status: Home / Self Care | Payer: Self-pay | Source: Ambulatory Visit | Attending: Urology

## 2018-03-24 ENCOUNTER — Ambulatory Visit (HOSPITAL_COMMUNITY): Payer: Managed Care, Other (non HMO)

## 2018-03-24 DIAGNOSIS — Z87891 Personal history of nicotine dependence: Secondary | ICD-10-CM | POA: Insufficient documentation

## 2018-03-24 DIAGNOSIS — M109 Gout, unspecified: Secondary | ICD-10-CM | POA: Diagnosis not present

## 2018-03-24 DIAGNOSIS — Z7984 Long term (current) use of oral hypoglycemic drugs: Secondary | ICD-10-CM | POA: Insufficient documentation

## 2018-03-24 DIAGNOSIS — I10 Essential (primary) hypertension: Secondary | ICD-10-CM | POA: Insufficient documentation

## 2018-03-24 DIAGNOSIS — F329 Major depressive disorder, single episode, unspecified: Secondary | ICD-10-CM | POA: Insufficient documentation

## 2018-03-24 DIAGNOSIS — E119 Type 2 diabetes mellitus without complications: Secondary | ICD-10-CM | POA: Diagnosis not present

## 2018-03-24 DIAGNOSIS — Z79899 Other long term (current) drug therapy: Secondary | ICD-10-CM | POA: Insufficient documentation

## 2018-03-24 DIAGNOSIS — E7800 Pure hypercholesterolemia, unspecified: Secondary | ICD-10-CM | POA: Insufficient documentation

## 2018-03-24 DIAGNOSIS — N201 Calculus of ureter: Secondary | ICD-10-CM

## 2018-03-24 DIAGNOSIS — N200 Calculus of kidney: Secondary | ICD-10-CM | POA: Diagnosis not present

## 2018-03-24 DIAGNOSIS — F419 Anxiety disorder, unspecified: Secondary | ICD-10-CM | POA: Diagnosis not present

## 2018-03-24 HISTORY — PX: EXTRACORPOREAL SHOCK WAVE LITHOTRIPSY: SHX1557

## 2018-03-24 HISTORY — DX: Type 2 diabetes mellitus without complications: E11.9

## 2018-03-24 HISTORY — DX: Personal history of urinary calculi: Z87.442

## 2018-03-24 LAB — GLUCOSE, CAPILLARY: Glucose-Capillary: 133 mg/dL — ABNORMAL HIGH (ref 70–99)

## 2018-03-24 SURGERY — LITHOTRIPSY, ESWL
Anesthesia: LOCAL | Laterality: Left

## 2018-03-24 MED ORDER — DIPHENHYDRAMINE HCL 25 MG PO CAPS
25.0000 mg | ORAL_CAPSULE | ORAL | Status: AC
  Administered 2018-03-24: 25 mg via ORAL
  Filled 2018-03-24: qty 1

## 2018-03-24 MED ORDER — SODIUM CHLORIDE 0.9 % IV SOLN
INTRAVENOUS | Status: DC
  Administered 2018-03-24: 10:00:00 via INTRAVENOUS

## 2018-03-24 MED ORDER — DIAZEPAM 5 MG PO TABS
10.0000 mg | ORAL_TABLET | ORAL | Status: AC
  Administered 2018-03-24: 10 mg via ORAL
  Filled 2018-03-24: qty 2

## 2018-03-24 MED ORDER — CIPROFLOXACIN HCL 500 MG PO TABS
500.0000 mg | ORAL_TABLET | ORAL | Status: AC
  Administered 2018-03-24: 500 mg via ORAL
  Filled 2018-03-24: qty 1

## 2018-03-24 MED ORDER — OXYCODONE HCL 5 MG PO TABS: 5.0000 mg | ORAL_TABLET | ORAL | Status: DC | PRN

## 2018-03-24 NOTE — Discharge Instructions (Signed)
See [facility] discharge instructions in chart.  

## 2018-03-24 NOTE — Op Note (Signed)
See [facility] OP note scanned into chart. Also because of the size, density, location and other factors that cannot be anticipated I feel this will likely be a staged procedure. This fact supersedes any indication in the scanned [facility] operative note to the contrary.  

## 2018-03-24 NOTE — H&P (Signed)
I have kidney stones.  HPI: Harry Rogers is a 62 year-old male patient who was referred by Dr. Meredith Staggers, MD who is here for renal calculi.  The problem is on the left side. He first stated noticing pain on 02/07/2018. This is not his first kidney stone. He has had more than 5 stones prior to getting this one. He is not currently having flank pain, back pain, groin pain, nausea, vomiting, fever or chills. He has not caught a stone in his urine strainer since his symptoms began.   He has never had surgical treatment for calculi in the past.   Patient went to the emergency department on 02/07/2018. This was for left-sided flank pain. CT scan revealed a 5 mm distal ureterovesicular junction calculus. He also had a 13 mm conglomerate of stone in the left proximal ureter and some nonobstructing calculi in the kidney. He has some microscopic hematuria today but otherwise is doing okay. He has had no pain in quite some time.     ALLERGIES: None   MEDICATIONS: Lisinopril 10 mg tablet  Metformin Hcl 500 mg tablet  Labetalol Hcl 100 mg tablet     GU PSH: None   NON-GU PSH: None   GU PMH: None   NON-GU PMH: Anxiety Arthritis Depression Diabetes Type 2 Gout Hypercholesterolemia Hypertension    FAMILY HISTORY: 1 Daughter - Other 1 son - Other Cancer - Mother   SOCIAL HISTORY: Marital Status: Married Preferred Language: English; Race: White Current Smoking Status: Patient does not smoke anymore. Has not smoked since 03/18/1988.   Tobacco Use Assessment Completed: Used Tobacco in last 30 days? Does not drink caffeine.    REVIEW OF SYSTEMS:    GU Review Male:   Patient reports erection problems. Patient denies frequent urination, hard to postpone urination, burning/ pain with urination, get up at night to urinate, leakage of urine, stream starts and stops, trouble starting your stream, have to strain to urinate , and penile pain.  Gastrointestinal (Upper):   Patient denies nausea,  vomiting, and indigestion/ heartburn.  Gastrointestinal (Lower):   Patient denies diarrhea and constipation.  Constitutional:   Patient denies fever, night sweats, weight loss, and fatigue.  Skin:   Patient reports skin rash/ lesion and itching.   Eyes:   Patient denies blurred vision and double vision.  Ears/ Nose/ Throat:   Patient denies sore throat and sinus problems.  Hematologic/Lymphatic:   Patient denies swollen glands and easy bruising.  Cardiovascular:   Patient denies leg swelling and chest pains.  Respiratory:   Patient denies cough and shortness of breath.  Endocrine:   Patient denies excessive thirst.  Musculoskeletal:   Patient reports back pain and joint pain.   Neurological:   Patient denies headaches and dizziness.  Psychologic:   Patient reports depression and anxiety.    VITAL SIGNS:      03/22/2018 09:58 AM  Weight 185 lb / 83.91 kg  Height 64 in / 162.56 cm  BP 160/100 mmHg  Heart Rate 83 /min  BMI 31.8 kg/m   MULTI-SYSTEM PHYSICAL EXAMINATION:    Constitutional: Well-nourished. No physical deformities. Normally developed. Good grooming.  Respiratory: No labored breathing, no use of accessory muscles.   Cardiovascular: Normal temperature, adequate perfusion of extremities  Skin: No paleness, no jaundice  Neurologic / Psychiatric: Oriented to time, oriented to place, oriented to person. No depression, no anxiety, no agitation.  Gastrointestinal: No mass, no tenderness, no rigidity, non obese abdomen.   Eyes: Normal conjunctivae.  Normal eyelids.  Musculoskeletal: Normal gait and station of head and neck.     PAST DATA REVIEWED:  Source Of History:  Patient  Records Review:   Previous Patient Records  X-Ray Review: C.T. Abdomen/Pelvis: Reviewed Films. Reviewed Report. Discussed With Patient.     PROCEDURES:         KUB - [id number]  A single view of the abdomen is obtained.               Urinalysis w/Scope Dipstick Dipstick Cont'd Micro  Color: Yellow  Bilirubin: Neg mg/dL WBC/hpf: NS (Not Seen)  Appearance: Clear Ketones: Neg mg/dL RBC/hpf: 10 - 16/XWR  Specific Gravity: 1.025 Blood: 3+ ery/uL Bacteria: NS (Not Seen)  pH: <=5.0 Protein: Trace mg/dL Cystals: Ca Oxalate  Glucose: Neg mg/dL Urobilinogen: 0.2 mg/dL Casts: Hyaline    Nitrites: Neg Trichomonas: Not Present    Leukocyte Esterase: Neg leu/uL Mucous: Not Present      Epithelial Cells: 0 - 5/hpf      Yeast: NS (Not Seen)      Sperm: Not Present    ASSESSMENT:      ICD-10 Details  1 GU:   Renal and ureteral calculus - N20.2   2   Microscopic hematuria - R31.21    PLAN:           Orders Labs Urine Culture  X-Rays: KUB          Schedule         Document Letter(s):  Created for Patient: Clinical Summary         Notes:   We discussed the management of urinary stones. These options include observation, ureteroscopy, and shockwave lithotripsy. We discussed which options are relevant to these particular stones. We discussed the natural history of stones as well as the complications of untreated stones and the impact on quality of life without treatment as well as with each of the above listed treatments. We also discussed the efficacy of each treatment in its ability to clear the stone burden. With any of these management options I discussed the signs and symptoms of infection and the need for emergent treatment should these be experienced. For each option we discussed the ability of each procedure to clear the patient of their stone burden.   For observation I described the risks which include but are not limited to silent renal damage, life-threatening infection, need for emergent surgery, failure to pass stone, and pain.   For ureteroscopy I described the risks which include heart attack, stroke, pulmonary embolus, death, bleeding, infection, damage to contiguous structures, positioning injury, ureteral stricture, ureteral avulsion, ureteral injury, need for ureteral stent,  inability to perform ureteroscopy, need for an interval procedure, inability to clear stone burden, stent discomfort and pain.   For shockwave lithotripsy I described the risks which include arrhythmia, kidney contusion, kidney hemorrhage, need for transfusion, pain, inability to break up stone, inability to pass stone fragments, Steinstrasse, infection associated with obstructing stones, need for different surgical procedure, need for repeat shockwave lithotripsy, and death.   After full informed discussion, the patient elects to proceed with left ESWL. He understands that he has multiple calculi and that the primary stone that we will target will be the one in the renal pelvis. He understands that there is a potential for needing multiple procedures to clear the stone. He desires the least invasive intervention.

## 2018-03-25 ENCOUNTER — Encounter (HOSPITAL_COMMUNITY): Payer: Self-pay | Admitting: Urology

## 2018-03-25 ENCOUNTER — Ambulatory Visit: Payer: Managed Care, Other (non HMO) | Admitting: Family Medicine

## 2018-03-25 ENCOUNTER — Other Ambulatory Visit: Payer: Self-pay

## 2018-03-25 VITALS — BP 142/82 | HR 90 | Temp 97.7°F | Ht 66.0 in | Wt 190.8 lb

## 2018-03-25 DIAGNOSIS — E1129 Type 2 diabetes mellitus with other diabetic kidney complication: Secondary | ICD-10-CM

## 2018-03-25 DIAGNOSIS — E870 Hyperosmolality and hypernatremia: Secondary | ICD-10-CM | POA: Diagnosis not present

## 2018-03-25 DIAGNOSIS — I10 Essential (primary) hypertension: Secondary | ICD-10-CM

## 2018-03-25 DIAGNOSIS — R809 Proteinuria, unspecified: Secondary | ICD-10-CM

## 2018-03-25 DIAGNOSIS — R739 Hyperglycemia, unspecified: Secondary | ICD-10-CM

## 2018-03-25 MED ORDER — METFORMIN HCL 500 MG PO TABS: 500.0000 mg | ORAL_TABLET | Freq: Two times a day (BID) | ORAL | 1 refills | Status: DC

## 2018-03-25 MED ORDER — LABETALOL HCL 100 MG PO TABS: 100.0000 mg | ORAL_TABLET | Freq: Two times a day (BID) | ORAL | 1 refills | Status: DC

## 2018-03-25 MED ORDER — BLOOD GLUCOSE METER KIT: PACK | 0 refills | Status: AC

## 2018-03-25 NOTE — Progress Notes (Signed)
Subjective:  By signing my name below, I, Harry Rogers, attest that this documentation has been prepared under the direction and in the presence of Harry Agreste, MD Electronically Signed: Ladene Rogers, ED Scribe 03/25/2018 at 12:00 PM.   Patient ID: Harry Rogers, male    DOB: November 16, 1955, 62 y.o.   MRN: [id number]  Chief Complaint  Patient presents with  . Blood Pressure Check  . Diabetes   HPI Harry Rogers "Harry Rogers" is a 62 y.o. male who presents to Primary Care at [facility] for f/u.  DM Lab Results  Component Value Date   HGBA1C 9.1 (H) 02/23/2018  Seen to establish care on 10/9. Had been recently diagnosis of DM, started on Metformin when seen in ER in Sept with glucose of 451. Started on Metformin 500 mg bid. Glucose improved to 124 at last OV. DM is complicated by microalbuminuria with ratio of 38 at last OV. Creatinine at last OV was normal at 0.86. - Pt is still taking Metformin bid. Denies side-effects. Reports having lithotripsy yesterday with a blood glucose of 136. He does not check his blood glucose at home; does not have a meter. Agrees to checking his blood glucose at home "sometimes" if prescribed a meter.  HTN Lab Results  Component Value Date   CREATININE 0.86 02/23/2018   BP Readings from Last 3 Encounters:  03/25/18 (!) 142/82  03/24/18 116/78  02/23/18 (!) 162/90  Still elevated last OV. Continued labetalol, added lisinopril 10 mg qd. - Denies missing any doses, any side-effects.  Elevated Troponin Pt was referred to cardiology last OV due to elevated troponin when seen in ER. - States he has not received a call from cardiology yet. Denies cp, sob.  Low CO2 Level of 13 at last OV. Advised to return for lab only visit which he refused. Calcium was also borderline elevated at 10.4, hyponatremic with potassium of 147.  Anxiety Pt also reports office anxiety/anxiety only with coming to the doctor's office. 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  There are no active problems to display for this patient.  Past Medical History:  Diagnosis Date  . Diabetes mellitus without complication ([place])   . History of kidney stones   . Hypertension    Past Surgical History:  Procedure Laterality Date  . EXTRACORPOREAL SHOCK WAVE LITHOTRIPSY Left 03/24/2018   Procedure: EXTRACORPOREAL SHOCK WAVE LITHOTRIPSY (ESWL);  Surgeon: Harry Hughs, MD;  Location: [facility];  Service: Urology;  Laterality: Left;   No Known Allergies Prior to Admission medications   Medication Sig Start Date End Date Taking? Authorizing Provider  labetalol (NORMODYNE) 100 MG tablet Take 1 tablet (100 mg total) by mouth 2 (two) times daily. 02/23/18   Harry Agreste, MD  lisinopril (PRINIVIL,ZESTRIL) 10 MG tablet Take 1 tablet (10 mg total) by mouth daily. 02/23/18   Harry Agreste, MD  metFORMIN (GLUCOPHAGE) 500 MG tablet Take 1 tablet (500 mg total) by mouth 2 (two) times daily with a meal. 02/23/18   Harry Agreste, MD   Social History   Socioeconomic History  . Marital status: Married    Spouse name: Not on file  . Number of children: Not on file  . Years of education: Not on file  . Highest education level: Not on file  Occupational History  . Not on file  Social Needs  . Emergency planning/management officer  strain: Not on file  . Food insecurity:    Worry: Not on file    Inability: Not on file  . Transportation needs:    Medical: Not on file    Non-medical: Not on file  Tobacco Use  . Smoking status: Never Smoker  . Smokeless tobacco: Never Used  Substance and Sexual Activity  . Alcohol use: Not on file  . Drug use: Not on file  . Sexual activity: Not on file  Lifestyle  . Physical activity:    Days per week: Not on file    Minutes per session: Not on file  . Stress: Not on file  Relationships  . Social connections:    Talks on phone: Not on file    Gets together:  Not on file    Attends religious service: Not on file    Active member of club or organization: Not on file    Attends meetings of clubs or organizations: Not on file    Relationship status: Not on file  . Intimate partner violence:    Fear of current or ex partner: Not on file    Emotionally abused: Not on file    Physically abused: Not on file    Forced sexual activity: Not on file  Other Topics Concern  . Not on file  Social History Narrative  . Not on file   Review of Systems  Constitutional: Negative for fatigue and unexpected weight change.  Eyes: Negative for visual disturbance.  Respiratory: Negative for cough, chest tightness and shortness of breath.   Cardiovascular: Negative for chest pain, palpitations and leg swelling.  Gastrointestinal: Negative for abdominal pain and blood in stool.  Neurological: Negative for dizziness, light-headedness and headaches.      Objective:   Physical Exam  Constitutional: He is oriented to person, place, and time. He appears well-developed and well-nourished.  HENT:  Head: Normocephalic and atraumatic.  Eyes: Pupils are equal, round, and reactive to light. EOM are normal.  Neck: No JVD present. Carotid bruit is not present.  Cardiovascular: Normal rate, regular rhythm and normal heart sounds.  No murmur heard. Pulmonary/Chest: Effort normal and breath sounds normal. He has no rales.  Musculoskeletal: He exhibits no edema.  Neurological: He is alert and oriented to person, place, and time.  Skin: Skin is warm and dry.  Psychiatric: He has a normal mood and affect.  Vitals reviewed.  Vitals:   03/25/18 1120 03/25/18 1123  BP: (!) 159/81 (!) 142/82  Pulse: 90   Temp: 97.7 F (36.5 C)   TempSrc: Oral   SpO2: 95%   Weight: 190 lb 12.8 oz (86.5 kg)   Height: 5' 6" (1.676 m)       Assessment & Plan:  Harry Rogers is a 62 y.o. male Diabetes mellitus with microalbuminuria ([place]) - Plan: blood glucose meter kit and supplies,  Comprehensive metabolic panel Hyperglycemia - Plan: metFORMIN (GLUCOPHAGE) 500 MG tablet  -Tolerating metformin.  Glucose meter prescribed for home monitoring and handout given on goal readings as well as other diabetes self-care.  Recheck in 2 months for repeat A1c.  Essential hypertension - Plan: Comprehensive metabolic panel, labetalol (NORMODYNE) 100 MG tablet  -Improved, but still borderline.  Continue same regimen, follow-up with cardiology to decide on further changes.  Stressed importance of cardiology follow-up given prior elevated troponin when he was in the hospital.  Asymptomatic at present.  Hypernatremia - Plan: Comprehensive metabolic panel Hypercalcemia - Plan: Comprehensive metabolic panel  -Repeat labs  as above  Meds ordered this encounter  Medications  . blood glucose meter kit and supplies    Sig: Dispense based on patient and insurance preference. Use once per day.    Dispense:  1 each    Refill:  0    Order Specific Question:   Number of strips    Answer:   100    Order Specific Question:   Number of lancets    Answer:   100  . labetalol (NORMODYNE) 100 MG tablet    Sig: Take 1 tablet (100 mg total) by mouth 2 (two) times daily.    Dispense:  180 tablet    Refill:  1  . metFORMIN (GLUCOPHAGE) 500 MG tablet    Sig: Take 1 tablet (500 mg total) by mouth 2 (two) times daily with a meal.    Dispense:  180 tablet    Refill:  1   Patient Instructions    Ok to continue metformin at same dose, but check blood sugars once per day. See info below for ideal range and other info on diabetes.   Blood pressure looks better - no med changes for now, but can be discussed with cardiology. Please call cardiology - we have sent referral prior so should be able to call and schedule appointment on your own.  [place] [facility]  Address: [address], [place], [place] [address]  Phone: [phone]  Thanks for coming in today.    Type 2 Diabetes Mellitus, Self Care, Adult When you have type 2 diabetes (type 2 diabetes mellitus), you must keep your blood sugar (glucose) under control. You can do this with:  Nutrition.  Exercise.  Lifestyle changes.  Medicines or insulin, if needed.  Support from your doctors and others.  How do I manage my blood sugar?  Check your blood sugar level every day, as often as told.  Call your doctor if your blood sugar is above your goal numbers for 2 tests in a row.  Have your A1c (hemoglobin A1c) level checked at least two times a year. Have it checked more often if your doctor tells you to. Your doctor will set treatment goals for you. Generally, you should have these blood sugar levels:  Before meals (preprandial): 80-130 mg/dL (4.4-7.2 mmol/L).  After meals (postprandial): lower than 180 mg/dL (10 mmol/L).  A1c level: less than 7%.  What do I need to know about high blood sugar? High blood sugar is called hyperglycemia. Know the signs of high blood sugar. Signs may include:  Feeling: ? Thirsty. ? Hungry. ? Very tired.  Needing to pee (urinate) more than usual.  Blurry vision.  What do I need to know about low blood sugar? Low blood sugar is called hypoglycemia. This is when blood sugar is at or below 70 mg/dL (3.9 mmol/L). Symptoms may include:  Feeling: ? Hungry. ? Worried or nervous (anxious). ? Sweaty and clammy. ? Confused. ? Dizzy. ? Sleepy. ? Sick to your stomach (nauseous).  Having: ? A fast heartbeat (palpitations). ? A headache. ? A change in your vision. ? Jerky movements that you cannot control (seizure). ? Nightmares. ? Tingling or no feeling (numbness) around the mouth, lips, or tongue.  Having trouble with: ? Talking. ? Paying attention (concentrating). ? Moving (coordination). ? Sleeping.  Shaking.  Passing out (fainting).  Getting upset easily (irritability).  Treating low blood sugar  To treat low blood sugar, eat  or drink something  sugary right away. If you can think clearly and swallow safely, follow the 15:15 rule:  Take 15 grams of a fast-acting carb (carbohydrate). Some fast-acting carbs are: ? 1 tube of glucose gel. ? 3 sugar tablets (glucose pills). ? 6-8 pieces of hard candy. ? 4 oz (120 mL) of fruit juice. ? 4 oz (120 mL) regular (not diet) soda.  Check your blood sugar 15 minutes after you take the carb.  If your blood sugar is still at or below 70 mg/dL (3.9 mmol/L), take 15 grams of a carb again.  If your blood sugar does not go above 70 mg/dL (3.9 mmol/L) after 3 tries, get help right away.  After your blood sugar goes back to normal, eat a meal or a snack within 1 hour.  Treating very low blood sugar If your blood sugar is at or below 54 mg/dL (3 mmol/L), you have very low blood sugar (severe hypoglycemia). This is an emergency. Do not wait to see if the symptoms will go away. Get medical help right away. Call your local emergency services (911 in the [place] Do not drive yourself to the hospital. If you have very low blood sugar and you cannot eat or drink, you may need a glucagon shot (injection). A family member or friend should learn how to check your blood sugar and how to give you a glucagon shot. Ask your doctor if you need to have a glucagon shot kit at home. What else is important to manage my diabetes? Medicine Follow these instructions about insulin and diabetes medicines:  Take them as told by your doctor.  Adjust them as told by your doctor.  Do not run out of them.  Having diabetes can raise your risk for other long-term conditions. These include heart or kidney disease. Your doctor may prescribe medicines to help prevent problems from diabetes. Food   Make healthy food choices. These include: ? Chicken, fish, egg whites, and beans. ? Oats, whole wheat, bulgur, brown rice, quinoa, and millet. ? Fresh fruits and vegetables. ? Low-fat dairy products. ? Nuts,  avocado, olive oil, and canola oil.  Make a food plan with a specialist (dietitian).  Follow instructions from your doctor about what you cannot eat or drink.  Drink enough fluid to keep your pee (urine) clear or pale yellow.  Eat healthy snacks between healthy meals.  Keep track of carbs that you eat. Read food labels. Learn food serving sizes.  Follow your sick day plan when you cannot eat or drink normally. Make this plan with your doctor so it is ready to use. Activity  Exercise at least 3 times a week.  Do not go more than 2 days without exercising.  Talk with your doctor before you start a new exercise. Your doctor may need to adjust your insulin, medicines, or food. Lifestyle   Do not use any tobacco products. These include cigarettes, chewing tobacco, and e-cigarettes.If you need help quitting, ask your doctor.  Ask your doctor how much alcohol is safe for you.  Learn to deal with stress. If you need help with this, ask your doctor. Body care  Stay up to date with your shots (immunizations).  Have your eyes and feet checked by a doctor as often as told.  Check your skin and feet every day. Check for cuts, bruises, redness, blisters, or sores.  Brush your teeth and gums two times a day.  Floss at least one time a day.  Go to the dentist least  one time every 6 months.  Stay at a healthy weight. General instructions   Take over-the-counter and prescription medicines only as told by your doctor.  Share your diabetes care plan with: ? Your work or school. ? People you live with.  Check your pee (urine) for ketones: ? When you are sick. ? As told by your doctor.  Carry a card or wear jewelry that says that you have diabetes.  Ask your doctor: ? Do I need to meet with a diabetes educator? ? Where can I find a support group for people with diabetes?  Keep all follow-up visits as told by your doctor. This is important. Where to find more information: To  learn more about diabetes, visit:  American Diabetes Association: www.diabetes.org  American Association of Diabetes Educators: www.diabeteseducator.org/patient-resources  This information is not intended to replace advice given to you by your health care provider. Make sure you discuss any questions you have with your health care provider. Document Released: 08/26/2015 Document Revised: 10/10/2015 Document Reviewed: 06/07/2015 Elsevier Interactive Patient Education  [employer].   If you have lab work done today you will be contacted with your lab results within the next 2 weeks.  If you have not heard from [place] then please contact us. The fastest way to get your results is to register for My Chart.   IF you received an x-ray today, you will receive an invoice from [facility]. Please contact [facility] at [phone] with questions or concerns regarding your invoice.   IF you received labwork today, you will receive an invoice from [facility]. Please contact [facility] at [phone] with questions or concerns regarding your invoice.   Our billing staff will not be able to assist you with questions regarding bills from these companies.  You will be contacted with the lab results as soon as they are available. The fastest way to get your results is to activate your My Chart account. Instructions are located on the last page of this paperwork. If you have not heard from [place] regarding the results in 2 weeks, please contact this office.    I personally performed the services described in this documentation, which was scribed in my presence. The recorded information has been reviewed and considered for accuracy and completeness, addended by me as needed, and agree with information above.  Signed,   Merri Ray, MD [facility] at [facility].  03/27/18 9:53 AM

## 2018-03-25 NOTE — Patient Instructions (Addendum)
Ok to continue metformin at same dose, but check blood sugars once per day. See info below for ideal range and other info on diabetes.   Blood pressure looks better - no med changes for now, but can be discussed with cardiology. Please call cardiology - we have sent referral prior so should be able to call and schedule appointment on your own.  [place] [facility]  Address: [address], [place], [place] [address]  Phone: [phone]  Thanks for coming in today.   Type 2 Diabetes Mellitus, Self Care, Adult When you have type 2 diabetes (type 2 diabetes mellitus), you must keep your blood sugar (glucose) under control. You can do this with:  Nutrition.  Exercise.  Lifestyle changes.  Medicines or insulin, if needed.  Support from your doctors and others.  How do I manage my blood sugar?  Check your blood sugar level every day, as often as told.  Call your doctor if your blood sugar is above your goal numbers for 2 tests in a row.  Have your A1c (hemoglobin A1c) level checked at least two times a year. Have it checked more often if your doctor tells you to. Your doctor will set treatment goals for you. Generally, you should have these blood sugar levels:  Before meals (preprandial): 80-130 mg/dL (4.4-7.2 mmol/L).  After meals (postprandial): lower than 180 mg/dL (10 mmol/L).  A1c level: less than 7%.  What do I need to know about high blood sugar? High blood sugar is called hyperglycemia. Know the signs of high blood sugar. Signs may include:  Feeling: ? Thirsty. ? Hungry. ? Very tired.  Needing to pee (urinate) more than usual.  Blurry vision.  What do I need to know about low blood sugar? Low blood sugar is called hypoglycemia. This is when blood sugar is at or below 70 mg/dL (3.9 mmol/L). Symptoms may include:  Feeling: ? Hungry. ? Worried or nervous (anxious). ? Sweaty and  clammy. ? Confused. ? Dizzy. ? Sleepy. ? Sick to your stomach (nauseous).  Having: ? A fast heartbeat (palpitations). ? A headache. ? A change in your vision. ? Jerky movements that you cannot control (seizure). ? Nightmares. ? Tingling or no feeling (numbness) around the mouth, lips, or tongue.  Having trouble with: ? Talking. ? Paying attention (concentrating). ? Moving (coordination). ? Sleeping.  Shaking.  Passing out (fainting).  Getting upset easily (irritability).  Treating low blood sugar  To treat low blood sugar, eat or drink something sugary right away. If you can think clearly and swallow safely, follow the 15:15 rule:  Take 15 grams of a fast-acting carb (carbohydrate). Some fast-acting carbs are: ? 1 tube of glucose gel. ? 3 sugar tablets (glucose pills). ? 6-8 pieces of hard candy. ? 4 oz (120 mL) of fruit juice. ? 4 oz (120 mL) regular (not diet) soda.  Check your blood sugar 15 minutes after you take the carb.  If your blood sugar is still at or below 70 mg/dL (3.9 mmol/L), take 15 grams of a carb again.  If your blood sugar does not go above 70 mg/dL (3.9 mmol/L) after 3 tries, get help right away.  After your blood sugar goes back to normal, eat a meal or a snack within 1 hour.  Treating very low blood sugar If your blood sugar is at or below 54 mg/dL (3 mmol/L), you have very low blood sugar (severe hypoglycemia). This is an emergency.  Do not wait to see if the symptoms will go away. Get medical help right away. Call your local emergency services (911 in the [place] Do not drive yourself to the hospital. If you have very low blood sugar and you cannot eat or drink, you may need a glucagon shot (injection). A family member or friend should learn how to check your blood sugar and how to give you a glucagon shot. Ask your doctor if you need to have a glucagon shot kit at home. What else is important to manage my diabetes? Medicine Follow these  instructions about insulin and diabetes medicines:  Take them as told by your doctor.  Adjust them as told by your doctor.  Do not run out of them.  Having diabetes can raise your risk for other long-term conditions. These include heart or kidney disease. Your doctor may prescribe medicines to help prevent problems from diabetes. Food   Make healthy food choices. These include: ? Chicken, fish, egg whites, and beans. ? Oats, whole wheat, bulgur, brown rice, quinoa, and millet. ? Fresh fruits and vegetables. ? Low-fat dairy products. ? Nuts, avocado, olive oil, and canola oil.  Make a food plan with a specialist (dietitian).  Follow instructions from your doctor about what you cannot eat or drink.  Drink enough fluid to keep your pee (urine) clear or pale yellow.  Eat healthy snacks between healthy meals.  Keep track of carbs that you eat. Read food labels. Learn food serving sizes.  Follow your sick day plan when you cannot eat or drink normally. Make this plan with your doctor so it is ready to use. Activity  Exercise at least 3 times a week.  Do not go more than 2 days without exercising.  Talk with your doctor before you start a new exercise. Your doctor may need to adjust your insulin, medicines, or food. Lifestyle   Do not use any tobacco products. These include cigarettes, chewing tobacco, and e-cigarettes.If you need help quitting, ask your doctor.  Ask your doctor how much alcohol is safe for you.  Learn to deal with stress. If you need help with this, ask your doctor. Body care  Stay up to date with your shots (immunizations).  Have your eyes and feet checked by a doctor as often as told.  Check your skin and feet every day. Check for cuts, bruises, redness, blisters, or sores.  Brush your teeth and gums two times a day.  Floss at least one time a day.  Go to the dentist least one time every 6 months.  Stay at a healthy weight. General  instructions   Take over-the-counter and prescription medicines only as told by your doctor.  Share your diabetes care plan with: ? Your work or school. ? People you live with.  Check your pee (urine) for ketones: ? When you are sick. ? As told by your doctor.  Carry a card or wear jewelry that says that you have diabetes.  Ask your doctor: ? Do I need to meet with a diabetes educator? ? Where can I find a support group for people with diabetes?  Keep all follow-up visits as told by your doctor. This is important. Where to find more information: To learn more about diabetes, visit:  American Diabetes Association: www.diabetes.org  American Association of Diabetes Educators: www.diabeteseducator.org/patient-resources  This information is not intended to replace advice given to you by your health care provider. Make sure you discuss any questions you have with your  health care provider. Document Released: 08/26/2015 Document Revised: 10/10/2015 Document Reviewed: 06/07/2015 Elsevier Interactive Patient Education  [employer].   If you have lab work done today you will be contacted with your lab results within the next 2 weeks.  If you have not heard from [place] then please contact us. The fastest way to get your results is to register for My Chart.   IF you received an x-ray today, you will receive an invoice from [facility]. Please contact [facility] at [phone] with questions or concerns regarding your invoice.   IF you received labwork today, you will receive an invoice from [facility]. Please contact [facility] at [phone] with questions or concerns regarding your invoice.   Our billing staff will not be able to assist you with questions regarding bills from these companies.  You will be contacted with the lab results as soon as they are available. The fastest way to get your results is to activate your My Chart account. Instructions are located  on the last page of this paperwork. If you have not heard from [place] regarding the results in 2 weeks, please contact this office.

## 2018-03-26 LAB — COMPREHENSIVE METABOLIC PANEL
ALT: 6 IU/L (ref 0–44)
AST: 6 IU/L (ref 0–40)
Albumin/Globulin Ratio: 1.9 (ref 1.2–2.2)
Albumin: 4.4 g/dL (ref 3.6–4.8)
Alkaline Phosphatase: 56 IU/L (ref 39–117)
BUN/Creatinine Ratio: 14 (ref 10–24)
BUN: 14 mg/dL (ref 8–27)
Bilirubin Total: 0.6 mg/dL (ref 0.0–1.2)
CALCIUM: 9.3 mg/dL (ref 8.6–10.2)
CO2: 18 mmol/L — AB (ref 20–29)
CREATININE: 0.98 mg/dL (ref 0.76–1.27)
Chloride: 103 mmol/L (ref 96–106)
GFR calc Af Amer: 94 mL/min/{1.73_m2} (ref >59)
GFR, EST NON AFRICAN AMERICAN: 81 mL/min/{1.73_m2} (ref >59)
GLOBULIN, TOTAL: 2.3 g/dL (ref 1.5–4.5)
Glucose: 145 mg/dL — ABNORMAL HIGH (ref 65–99)
Potassium: 4.5 mmol/L (ref 3.5–5.2)
SODIUM: 136 mmol/L (ref 134–144)
TOTAL PROTEIN: 6.7 g/dL (ref 6.0–8.5)

## 2018-05-23 ENCOUNTER — Other Ambulatory Visit: Payer: Self-pay | Admitting: Family Medicine

## 2018-05-23 DIAGNOSIS — I10 Essential (primary) hypertension: Secondary | ICD-10-CM

## 2018-05-23 DIAGNOSIS — R739 Hyperglycemia, unspecified: Secondary | ICD-10-CM

## 2018-05-30 ENCOUNTER — Ambulatory Visit: Payer: Managed Care, Other (non HMO) | Admitting: Family Medicine

## 2018-06-07 ENCOUNTER — Ambulatory Visit (INDEPENDENT_AMBULATORY_CARE_PROVIDER_SITE_OTHER): Payer: Managed Care, Other (non HMO) | Admitting: Urology

## 2018-06-07 ENCOUNTER — Encounter: Payer: Self-pay | Admitting: Urology

## 2018-06-07 VITALS — BP 167/91 | HR 80 | Ht 65.0 in | Wt 186.0 lb

## 2018-06-07 DIAGNOSIS — N200 Calculus of kidney: Secondary | ICD-10-CM

## 2018-06-07 DIAGNOSIS — R1084 Generalized abdominal pain: Secondary | ICD-10-CM

## 2018-06-07 LAB — URINALYSIS, COMPLETE
Bilirubin, UA: NEGATIVE
Ketones, UA: NEGATIVE
LEUKOCYTES UA: NEGATIVE
Nitrite, UA: NEGATIVE
PH UA: 5.5 (ref 5.0–7.5)
Protein, UA: NEGATIVE
RBC, UA: NEGATIVE
Specific Gravity, UA: 1.025 (ref 1.005–1.030)
Urobilinogen, Ur: 0.2 mg/dL (ref 0.2–1.0)

## 2018-06-07 LAB — MICROSCOPIC EXAMINATION
RBC, UA: NONE SEEN /hpf (ref 0–2)
WBC, UA: NONE SEEN /hpf (ref 0–5)

## 2018-06-07 NOTE — Progress Notes (Signed)
06/07/2018 4:38 PM   Harry Rogers 05-20-1955 [id number]  Referring provider: Wendie Agreste, MD [address] [place], [place] [address]  CC: Nephrolithiasis  HPI: I saw Mr. Harry Rogers in urology clinic today for history of nephrolithiasis.  He is a 63 year old male with history of diabetes and anxiety who was previously managed by Dr. Louis Meckel and Dr. Gloriann Loan at [facility] urology in [place].  He is a very poor historian and has an odd affect and the history is difficult to obtain.  From chart review, he originally presented in September 2019 with left-sided flank pain and found to have a 5 mm left distal ureteral stone with upstream hydronephrosis, as well as significant stone burden greater than 1 cm in the left renal pelvis and greater than 1 cm in the left lower pole.  It sounds like he spontaneously passed his distal 5 mm stone, and then underwent shockwave lithotripsy of the 1 cm renal pelvis stone with Dr. Louis Meckel on 03/24/2018.  Dr. Carlton Adam note from that day suggested this would require staged procedure with follow-up shockwave versus ureteroscopy.  There is no imaging in our system after his original shockwave procedure in November.  He also has had persistent microscopic hematuria, and deferred further hematuria work-up with the [facility].  He reports he had never had kidney stones prior to this episode in fall 2019.  He denies any current flank pain, dysuria, urgency, frequency, or gross hematuria.  He denies fevers or chills.  There are no aggravating or alleviating factors.  Severity is mild.  He states that he is transferring care to [place] in [place] because of an insurance change.   PMH: Past Medical History:  Diagnosis Date  . Diabetes mellitus without complication ([place])   . History of kidney stones   . Hypertension     Surgical History: Past Surgical History:  Procedure Laterality Date  . EXTRACORPOREAL SHOCK WAVE LITHOTRIPSY Left 03/24/2018   Procedure:  EXTRACORPOREAL SHOCK WAVE LITHOTRIPSY (ESWL);  Surgeon: Ardis Hughs, MD;  Location: [facility];  Service: Urology;  Laterality: Left;    Allergies: No Known Allergies  Family History: No family history on file.  Social History:  reports that he has never smoked. He has never used smokeless tobacco. No history on file for alcohol and drug.  ROS: Please see flowsheet from today's date for complete review of systems.  Physical Exam: BP (!) 167/91 (BP Location: Left Arm, Patient Position: Sitting)   Pulse 80   Ht 5' 5"  (1.651 m)   Wt 186 lb (84.4 kg)   BMI 30.95 kg/m    Constitutional:  Alert and oriented, No acute distress. Cardiovascular: No clubbing, cyanosis, or edema. Respiratory: Normal respiratory effort, no increased work of breathing. GI: Abdomen is soft, nontender, nondistended, no abdominal masses GU: No CVA tenderness Lymph: No cervical or inguinal lymphadenopathy. Skin: No rashes, bruises or suspicious lesions. Neurologic: Grossly intact, no focal deficits, moving all 4 extremities. Psychiatric: Tangential thought process, odd affect  Laboratory Data: 03/25/2018: Creatinine 0.98, EGFR 81  Urinalysis today few bacteria, 0 RBCs, 0 WBCs with nitrite negative  Pertinent Imaging: I have personally reviewed the original CT from 02/07/2018, and KUB from day of shockwave 03/24/2018.  The HPI for details  Assessment & Plan:   In summary, the patient is a 63 year old male who originally presented in September 2019 with significant left-sided renal stone burden as well as a 5 mm distal ureteral stone.  Per report, he spontaneously passed his left distal  ureteral stone, and then underwent SWL with Dr. Louis Meckel on 03/24/2018 for his 1.3 cm renal pelvis stone.  No follow-up imaging to review results from this procedure.  He is asymptomatic today.  We discussed the need for further imaging to evaluate for residual stone burden, and rule out silent hydronephrosis especially in the  setting of his high volume left-sided stone disease.  CT stone protocol ordered, will call with results  [place], [place] [address] [place], [place] [address] [phone]

## 2018-06-07 NOTE — Patient Instructions (Signed)
Dietary Guidelines to Help Prevent Kidney Stones  Kidney stones are deposits of minerals and salts that form inside your kidneys. Your risk of developing kidney stones may be greater depending on your diet, your lifestyle, the medicines you take, and whether you have certain medical conditions. Most people can reduce their chances of developing kidney stones by following the instructions below. Depending on your overall health and the type of kidney stones you tend to develop, your dietitian may give you more specific instructions.  What are tips for following this plan?  Reading food labels   Choose foods with "no salt added" or "low-salt" labels. Limit your sodium intake to less than 1500 mg per day.   Choose foods with calcium for each meal and snack. Try to eat about 300 mg of calcium at each meal. Foods that contain 200-500 mg of calcium per serving include:  ? 8 oz (237 ml) of milk, fortified nondairy milk, and fortified fruit juice.  ? 8 oz (237 ml) of kefir, yogurt, and soy yogurt.  ? 4 oz (118 ml) of tofu.  ? 1 oz of cheese.  ? 1 cup (300 g) of dried figs.  ? 1 cup (91 g) of cooked broccoli.  ? 1-3 oz can of sardines or mackerel.   Most people need 1000 to 1500 mg of calcium each day. Talk to your dietitian about how much calcium is recommended for you.  Shopping   Buy plenty of fresh fruits and vegetables. Most people do not need to avoid fruits and vegetables, even if they contain nutrients that may contribute to kidney stones.   When shopping for convenience foods, choose:  ? Whole pieces of fruit.  ? Premade salads with dressing on the side.  ? Low-fat fruit and yogurt smoothies.   Avoid buying frozen meals or prepared deli foods.   Look for foods with live cultures, such as yogurt and kefir.  Cooking   Do not add salt to food when cooking. Place a salt shaker on the table and allow each person to add his or her own salt to taste.   Use vegetable protein, such as beans, textured vegetable  protein (TVP), or tofu instead of meat in pasta, casseroles, and soups.  Meal planning     Eat less salt, if told by your dietitian. To do this:  ? Avoid eating processed or premade food.  ? Avoid eating fast food.   Eat less animal protein, including cheese, meat, poultry, or fish, if told by your dietitian. To do this:  ? Limit the number of times you have meat, poultry, fish, or cheese each week. Eat a diet free of meat at least 2 days a week.  ? Eat only one serving each day of meat, poultry, fish, or seafood.  ? When you prepare animal protein, cut pieces into small portion sizes. For most meat and fish, one serving is about the size of one deck of cards.   Eat at least 5 servings of fresh fruits and vegetables each day. To do this:  ? Keep fruits and vegetables on hand for snacks.  ? Eat 1 piece of fruit or a handful of berries with breakfast.  ? Have a salad and fruit at lunch.  ? Have two kinds of vegetables at dinner.   Limit foods that are high in a substance called oxalate. These include:  ? Spinach.  ? Rhubarb.  ? Beets.  ? Potato chips and french fries.  ? Nuts.     If you regularly take a diuretic medicine, make sure to eat at least 1-2 fruits or vegetables high in potassium each day. These include:  ? Avocado.  ? Banana.  ? Orange, prune, carrot, or tomato juice.  ? Baked potato.  ? Cabbage.  ? Beans and split peas.  General instructions     Drink enough fluid to keep your urine clear or pale yellow. This is the most important thing you can do.   Talk to your health care provider and dietitian about taking daily supplements. Depending on your health and the cause of your kidney stones, you may be advised:  ? Not to take supplements with vitamin C.  ? To take a calcium supplement.  ? To take a daily probiotic supplement.  ? To take other supplements such as magnesium, fish oil, or vitamin B6.   Take all medicines and supplements as told by your health care provider.   Limit alcohol intake to no  more than 1 drink a day for nonpregnant women and 2 drinks a day for men. One drink equals 12 oz of beer, 5 oz of wine, or 1 oz of hard liquor.   Lose weight if told by your health care provider. Work with your dietitian to find strategies and an eating plan that works best for you.  What foods are not recommended?  Limit your intake of the following foods, or as told by your dietitian. Talk to your dietitian about specific foods you should avoid based on the type of kidney stones and your overall health.  Grains  Breads. Bagels. Rolls. Baked goods. Salted crackers. Cereal. Pasta.  Vegetables  Spinach. Rhubarb. Beets. Canned vegetables. Pickles. Olives.  Meats and other protein foods  Nuts. Nut butters. Large portions of meat, poultry, or fish. Salted or cured meats. Deli meats. Hot dogs. Sausages.  Dairy  Cheese.  Beverages  Regular soft drinks. Regular vegetable juice.  Seasonings and other foods  Seasoning blends with salt. Salad dressings. Canned soups. Soy sauce. Ketchup. Barbecue sauce. Canned pasta sauce. Casseroles. Pizza. Lasagna. Frozen meals. Potato chips. French fries.  Summary   You can reduce your risk of kidney stones by making changes to your diet.   The most important thing you can do is drink enough fluid. You should drink enough fluid to keep your urine clear or pale yellow.   Ask your health care provider or dietitian how much protein from animal sources you should eat each day, and also how much salt and calcium you should have each day.  This information is not intended to replace advice given to you by your health care provider. Make sure you discuss any questions you have with your health care provider.  Document Released: 08/29/2010 Document Revised: 04/14/2016 Document Reviewed: 04/14/2016  Elsevier Interactive Patient Education  2019 [employer] Inc[employer]

## 2018-06-24 ENCOUNTER — Ambulatory Visit: Payer: Managed Care, Other (non HMO)

## 2018-07-17 ENCOUNTER — Other Ambulatory Visit: Payer: Self-pay | Admitting: Family Medicine

## 2018-07-18 NOTE — Telephone Encounter (Signed)
Patient called and advised to call the pharmacy to let them know he is no longer a patient of Dr. Neva Seat and to send all refill requests to his PCP's office, Dr. Letitia Libra, patient verbalized understanding.

## 2018-08-24 ENCOUNTER — Other Ambulatory Visit: Payer: Self-pay | Admitting: Family Medicine

## 2018-08-24 DIAGNOSIS — I10 Essential (primary) hypertension: Secondary | ICD-10-CM

## 2018-11-14 ENCOUNTER — Other Ambulatory Visit: Payer: Self-pay | Admitting: Family Medicine

## 2018-11-27 ENCOUNTER — Other Ambulatory Visit: Payer: Self-pay | Admitting: Family Medicine

## 2018-11-27 DIAGNOSIS — I10 Essential (primary) hypertension: Secondary | ICD-10-CM

## 2018-11-27 DIAGNOSIS — R739 Hyperglycemia, unspecified: Secondary | ICD-10-CM

## 2018-11-27 NOTE — Telephone Encounter (Signed)
Requested Prescriptions  Pending Prescriptions Disp Refills  . lisinopril (ZESTRIL) 10 MG tablet [Pharmacy Med Name: LISINOPRIL 10MG TABLETS] 90 tablet 0    Sig: TAKE 1 TABLET BY MOUTH DAILY     Cardiovascular:  ACE Inhibitors Failed - 11/27/2018 11:46 AM      Failed - Cr in normal range and within 180 days    Creatinine, Ser  Date Value Ref Range Status  03/25/2018 0.98 0.76 - 1.27 mg/dL Final         Failed - K in normal range and within 180 days    Potassium  Date Value Ref Range Status  03/25/2018 4.5 3.5 - 5.2 mmol/L Final         Failed - Last BP in normal range    BP Readings from Last 1 Encounters:  06/07/18 (!) 167/91         Failed - Valid encounter within last 6 months    Recent Outpatient Visits          8 months ago Diabetes mellitus with microalbuminuria [facility])   [facility] [place], MD   9 months ago Essential hypertension   [facility] [place], Ranell Patrick, MD             Passed - Patient is not pregnant      . metFORMIN (GLUCOPHAGE) 500 MG tablet [Pharmacy Med Name: METFORMIN 500MG TABLETS] 180 tablet 1    Sig: TAKE 1 TABLET(500 MG) BY MOUTH TWICE DAILY WITH A MEAL     Endocrinology:  Diabetes - Biguanides Failed - 11/27/2018 11:46 AM      Failed - HBA1C is between 0 and 7.9 and within 180 days    Hgb A1c MFr Bld  Date Value Ref Range Status  02/23/2018 9.1 (H) 4.8 - 5.6 % Final    Comment:             Prediabetes: 5.7 - 6.4          Diabetes: >6.4          Glycemic control for adults with diabetes: <7.0          Failed - Valid encounter within last 6 months    Recent Outpatient Visits          8 months ago Diabetes mellitus with microalbuminuria ([place])   [facility] at [place], MD   9 months ago Essential hypertension   [facility], Ranell Patrick, MD             Passed - Cr in normal range and within 360 days    Creatinine, Ser  Date Value Ref Range Status  03/25/2018 0.98  0.76 - 1.27 mg/dL Final         Passed - eGFR in normal range and within 360 days    GFR calc Af Amer  Date Value Ref Range Status  03/25/2018 94 >59 mL/min/1.73 Final   GFR calc non Af Amer  Date Value Ref Range Status  03/25/2018 81 >59 mL/min/1.73 Final         . labetalol (NORMODYNE) 100 MG tablet [Pharmacy Med Name: LABETALOL 100MG TABLETS] 180 tablet 1    Sig: TAKE 1 TABLET(100 MG) BY MOUTH TWICE DAILY     Cardiovascular:  Beta Blockers Failed - 11/27/2018 11:46 AM      Failed - Last BP in normal range    BP Readings from Last 1 Encounters:  06/07/18 [facility])  167/91         Failed - Valid encounter within last 6 months    Recent Outpatient Visits          8 months ago Diabetes mellitus with microalbuminuria [facility])   [facility] [place] [facility], Ranell Patrick, MD   9 months ago Essential hypertension   [facility], Ranell Patrick, MD             Passed - Last Heart Rate in normal range    Pulse Readings from Last 1 Encounters:  06/07/18 80

## 2019-01-04 ENCOUNTER — Other Ambulatory Visit: Payer: Self-pay | Admitting: Family Medicine

## 2019-01-04 DIAGNOSIS — I10 Essential (primary) hypertension: Secondary | ICD-10-CM

## 2019-01-14 ENCOUNTER — Other Ambulatory Visit: Payer: Self-pay | Admitting: Family Medicine

## 2019-01-14 DIAGNOSIS — I10 Essential (primary) hypertension: Secondary | ICD-10-CM

## 2019-01-15 NOTE — Telephone Encounter (Signed)
Requested medication (s) are due for refill today: yes  Requested medication (s) are on the active medication list: yes  Last refill:  11/27/18  #60  0 refills  Future visit scheduled: No  Notes to clinic: Patient needs OV for refills    Requested Prescriptions  Pending Prescriptions Disp Refills   labetalol (NORMODYNE) 100 MG tablet [Pharmacy Med Name: LABETALOL 100MG  TABLETS] 60 tablet 0    Sig: TAKE 1 TABLET(100 MG) BY MOUTH TWICE DAILY     Cardiovascular:  Beta Blockers Failed - 01/14/2019  4:57 PM      Failed - Last BP in normal range    BP Readings from Last 1 Encounters:  06/07/18 (!) 167/91         Failed - Valid encounter within last 6 months    Recent Outpatient Visits          9 months ago Diabetes mellitus with microalbuminuria ([place])   [facility], Ranell Patrick, MD   10 months ago Essential hypertension   [facility], Ranell Patrick, MD             Passed - Last Heart Rate in normal range    Pulse Readings from Last 1 Encounters:  06/07/18 80          lisinopril (ZESTRIL) 10 MG tablet [Pharmacy Med Name: LISINOPRIL 10MG  TABLETS] 30 tablet 0    Sig: TAKE 1 TABLET BY MOUTH DAILY     Cardiovascular:  ACE Inhibitors Failed - 01/14/2019  4:57 PM      Failed - Cr in normal range and within 180 days    Creatinine, Ser  Date Value Ref Range Status  03/25/2018 0.98 0.76 - 1.27 mg/dL Final         Failed - K in normal range and within 180 days    Potassium  Date Value Ref Range Status  03/25/2018 4.5 3.5 - 5.2 mmol/L Final         Failed - Last BP in normal range    BP Readings from Last 1 Encounters:  06/07/18 (!) 167/91         Failed - Valid encounter within last 6 months    Recent Outpatient Visits          9 months ago Diabetes mellitus with microalbuminuria [facility])   [facility] at [place], MD   10 months ago Essential hypertension   [facility], Ranell Patrick, MD            Passed - Patient is not pregnant

## 2019-01-18 NOTE — Telephone Encounter (Signed)
Denied medications due to patient needing an OV for additional refills.

## 2019-01-19 NOTE — Telephone Encounter (Signed)
Tranferred to a new provider at [facility]

## 2019-12-15 IMAGING — CR DG ABDOMEN 1V
2 series · 2 of 2 positions shown · non-contrast
Comparison: CT urogram 02/07/2018.

CLINICAL DATA: Pre litho.

EXAM:
ABDOMEN - 1 VIEW

[t abdomen supine * (1 of 2)]
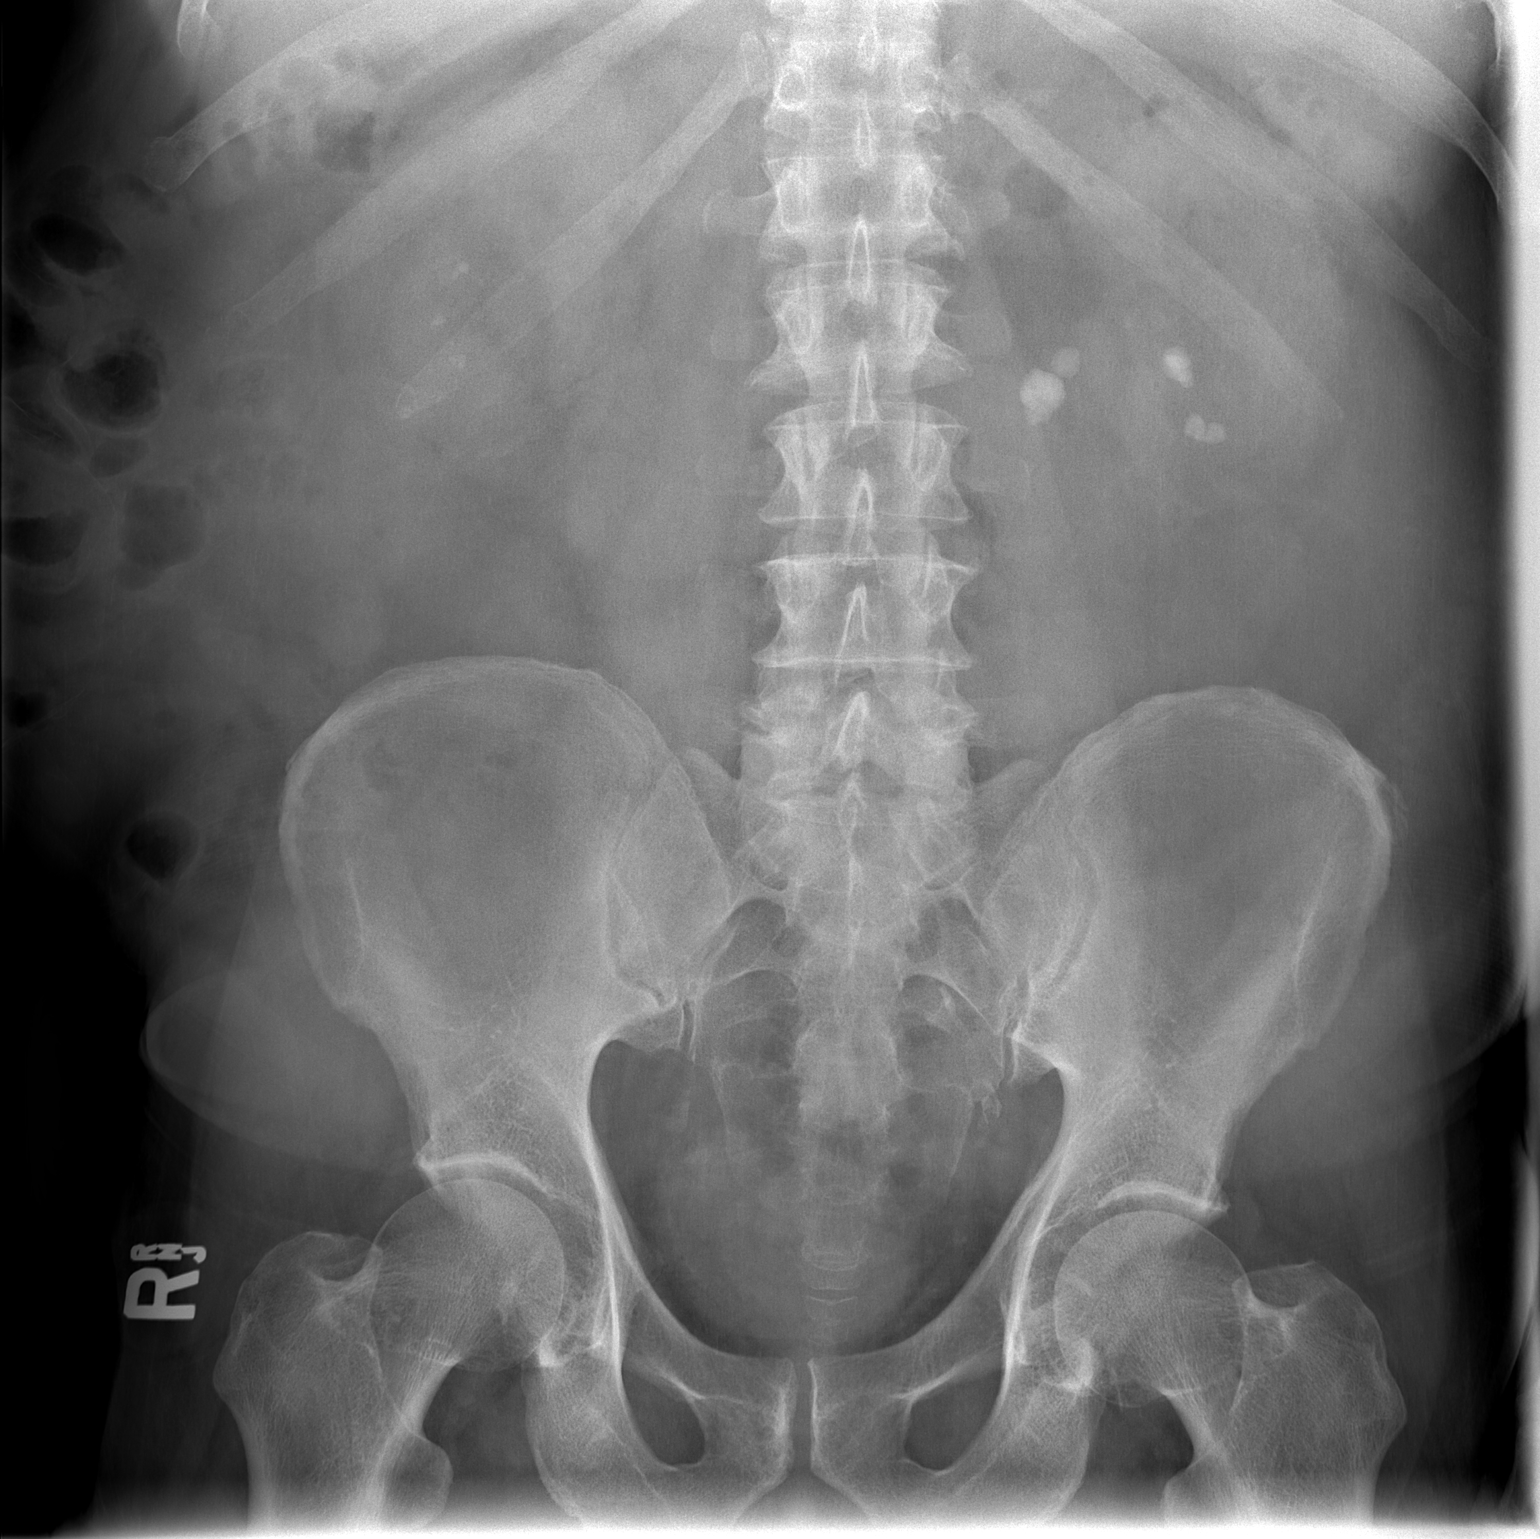

[t abdomen supine * (2 of 2)]
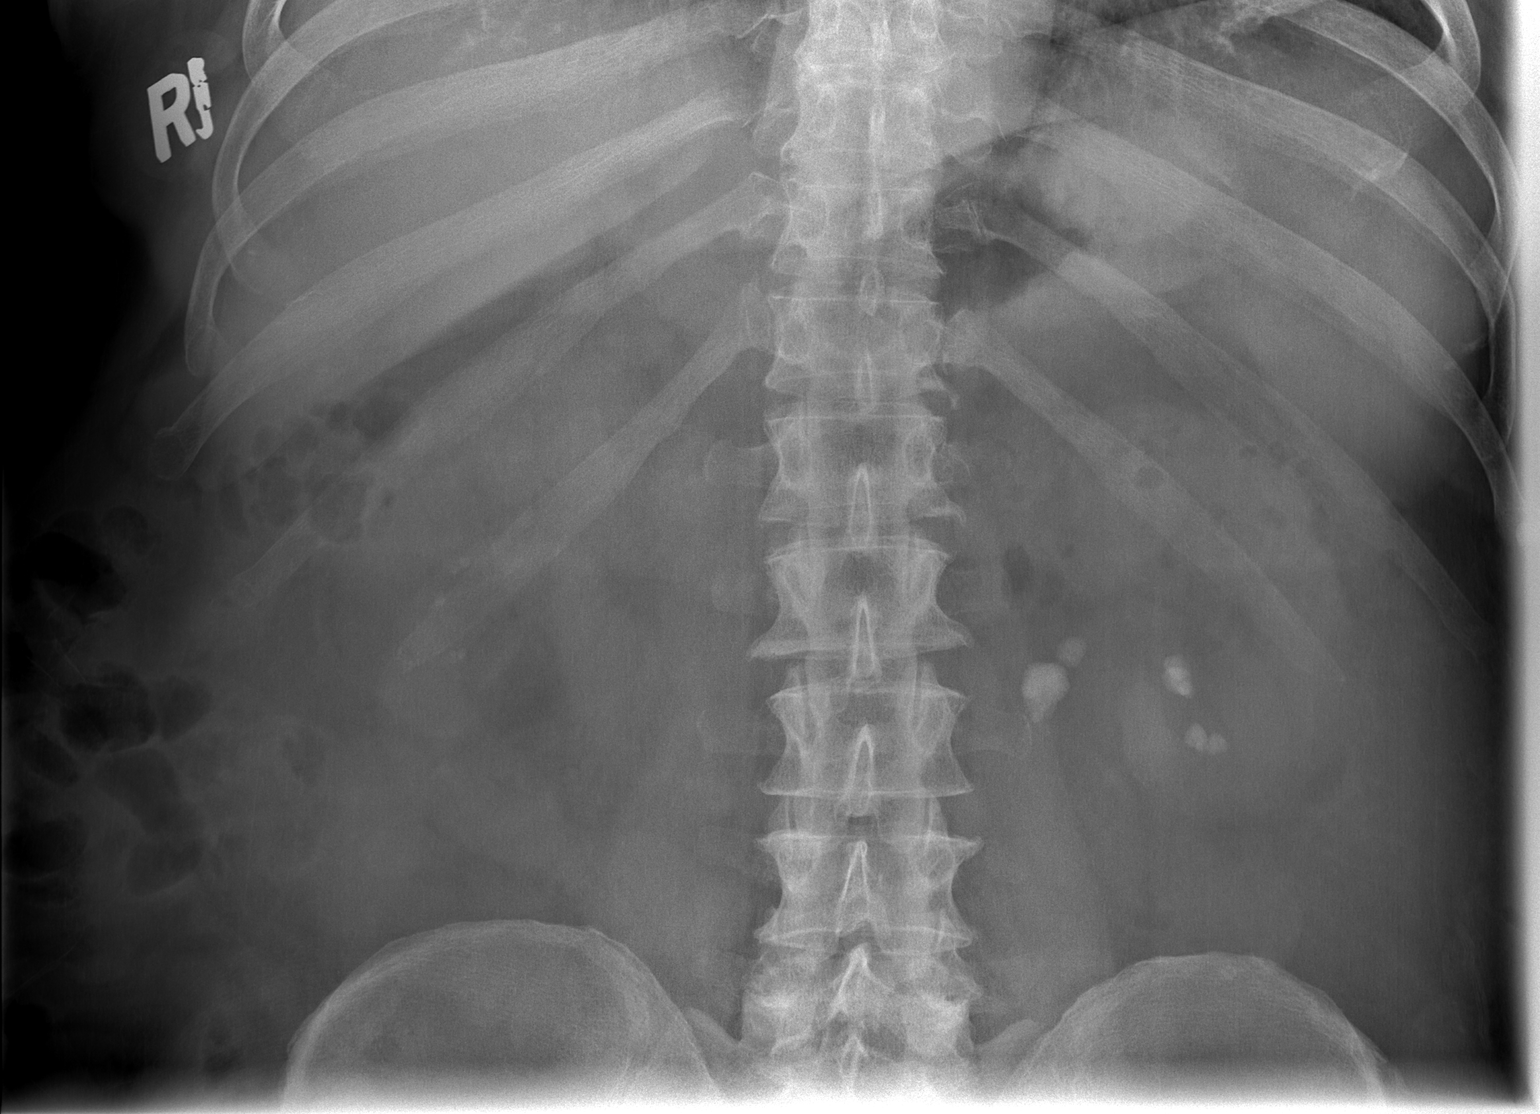

[2 of 2 positions shown; findings below may reference images not displayed]

FINDINGS: Multiple radiodense calculi overlie the LEFT kidney and LEFT
collecting system. The largest is seen opposite the L2-3 interspace,
roughly 13 mm. Distal LEFT ureteral stone is not established. Much
smaller calculi overlie the RIGHT kidney.
IMPRESSION: BILATERAL LEFT greater than RIGHT nephrolithiasis.

## 2021-05-21 ENCOUNTER — Emergency Department: Payer: Medicare Other

## 2021-05-21 ENCOUNTER — Other Ambulatory Visit: Payer: Self-pay

## 2021-05-21 DIAGNOSIS — R0602 Shortness of breath: Secondary | ICD-10-CM | POA: Diagnosis not present

## 2021-05-21 DIAGNOSIS — Z5321 Procedure and treatment not carried out due to patient leaving prior to being seen by health care provider: Secondary | ICD-10-CM | POA: Diagnosis not present

## 2021-05-21 DIAGNOSIS — M7989 Other specified soft tissue disorders: Secondary | ICD-10-CM | POA: Insufficient documentation

## 2021-05-21 LAB — BASIC METABOLIC PANEL
Anion gap: 11 (ref 5–15)
BUN: 11 mg/dL (ref 8–23)
CO2: 23 mmol/L (ref 22–32)
Calcium: 8.7 mg/dL — ABNORMAL LOW (ref 8.9–10.3)
Chloride: 100 mmol/L (ref 98–111)
Creatinine, Ser: 0.89 mg/dL (ref 0.61–1.24)
GFR, Estimated: >60 mL/min (ref >60)
Glucose, Bld: 220 mg/dL — ABNORMAL HIGH (ref 70–99)
Potassium: 4.5 mmol/L (ref 3.5–5.1)
Sodium: 134 mmol/L — ABNORMAL LOW (ref 135–145)

## 2021-05-21 LAB — CBC
HCT: 47.7 % (ref 39.0–52.0)
Hemoglobin: 15 g/dL (ref 13.0–17.0)
MCH: 26.6 pg (ref 26.0–34.0)
MCHC: 31.4 g/dL (ref 30.0–36.0)
MCV: 84.7 fL (ref 80.0–100.0)
Platelets: 181 10*3/uL (ref 150–400)
RBC: 5.63 MIL/uL (ref 4.22–5.81)
RDW: 14.4 % (ref 11.5–15.5)
WBC: 4.7 10*3/uL (ref 4.0–10.5)
nRBC: 0 % (ref 0.0–0.2)

## 2021-05-21 LAB — TROPONIN I (HIGH SENSITIVITY)
Troponin I (High Sensitivity): 88 ng/L — ABNORMAL HIGH (ref <18)
Troponin I (High Sensitivity): 95 ng/L — ABNORMAL HIGH (ref <18)

## 2021-05-21 LAB — BRAIN NATRIURETIC PEPTIDE: B Natriuretic Peptide: 1082.1 pg/mL — ABNORMAL HIGH (ref 0.0–100.0)

## 2021-05-21 NOTE — ED Triage Notes (Signed)
Pt to ED from [facility] for BLE swelling, DOE for the past week.  Hx MI. Swelling noted to BLE.  Denies chest pain at this time.  UC reports abnormal EKG.   Pt had echo scheduled, did not go

## 2021-05-22 ENCOUNTER — Emergency Department
Admission: EM | Admit: 2021-05-22 | Discharge: 2021-05-22 | Disposition: A | Discharge status: Home / Self Care | Payer: Medicare Other | Attending: Emergency Medicine | Admitting: Emergency Medicine

## 2021-05-22 MED ORDER — ASPIRIN 81 MG PO CHEW: 324.0000 mg | CHEWABLE_TABLET | Freq: Once | ORAL | Status: DC

## 2021-05-22 NOTE — ED Notes (Signed)
CALLED X2 FOR VITALS NO ANSWER °

## 2021-07-31 ENCOUNTER — Telehealth (HOSPITAL_COMMUNITY): Payer: Self-pay | Admitting: *Deleted

## 2021-07-31 NOTE — Telephone Encounter (Signed)
Received referral from Dr Kelle Darting at [facility] for this pt to participate in cardiac rehab s/p chronic systolic heart failure.  Noted in CareEverywhere workup for CABG.  Pt completed his post surgical follow up with Dr. Romona Curls at [facility]. Called and spoke to pt regarding preference in location.  [place] [facility] as he lives in [place].  Advised that he plans to do Cardiac rehab at [facility].  Thanked for the call. Alanson Aly, BSN ?Cardiac and Pulmonary Rehab Nurse Navigator  ? ?

## 2021-08-17 ENCOUNTER — Encounter: Payer: Self-pay | Admitting: Registered Nurse

## 2023-02-11 IMAGING — CR DG CHEST 2V
1 series · 2 of 2 positions shown · non-contrast
Comparison: 02/07/2018

CLINICAL DATA: Short of breath.  Bilateral leg swelling.

EXAM:
CHEST - 2 VIEW

[Series 1: dg chest 2 view · 0.14mm/px · 2 of 2 slices shown]
[im 1/2]
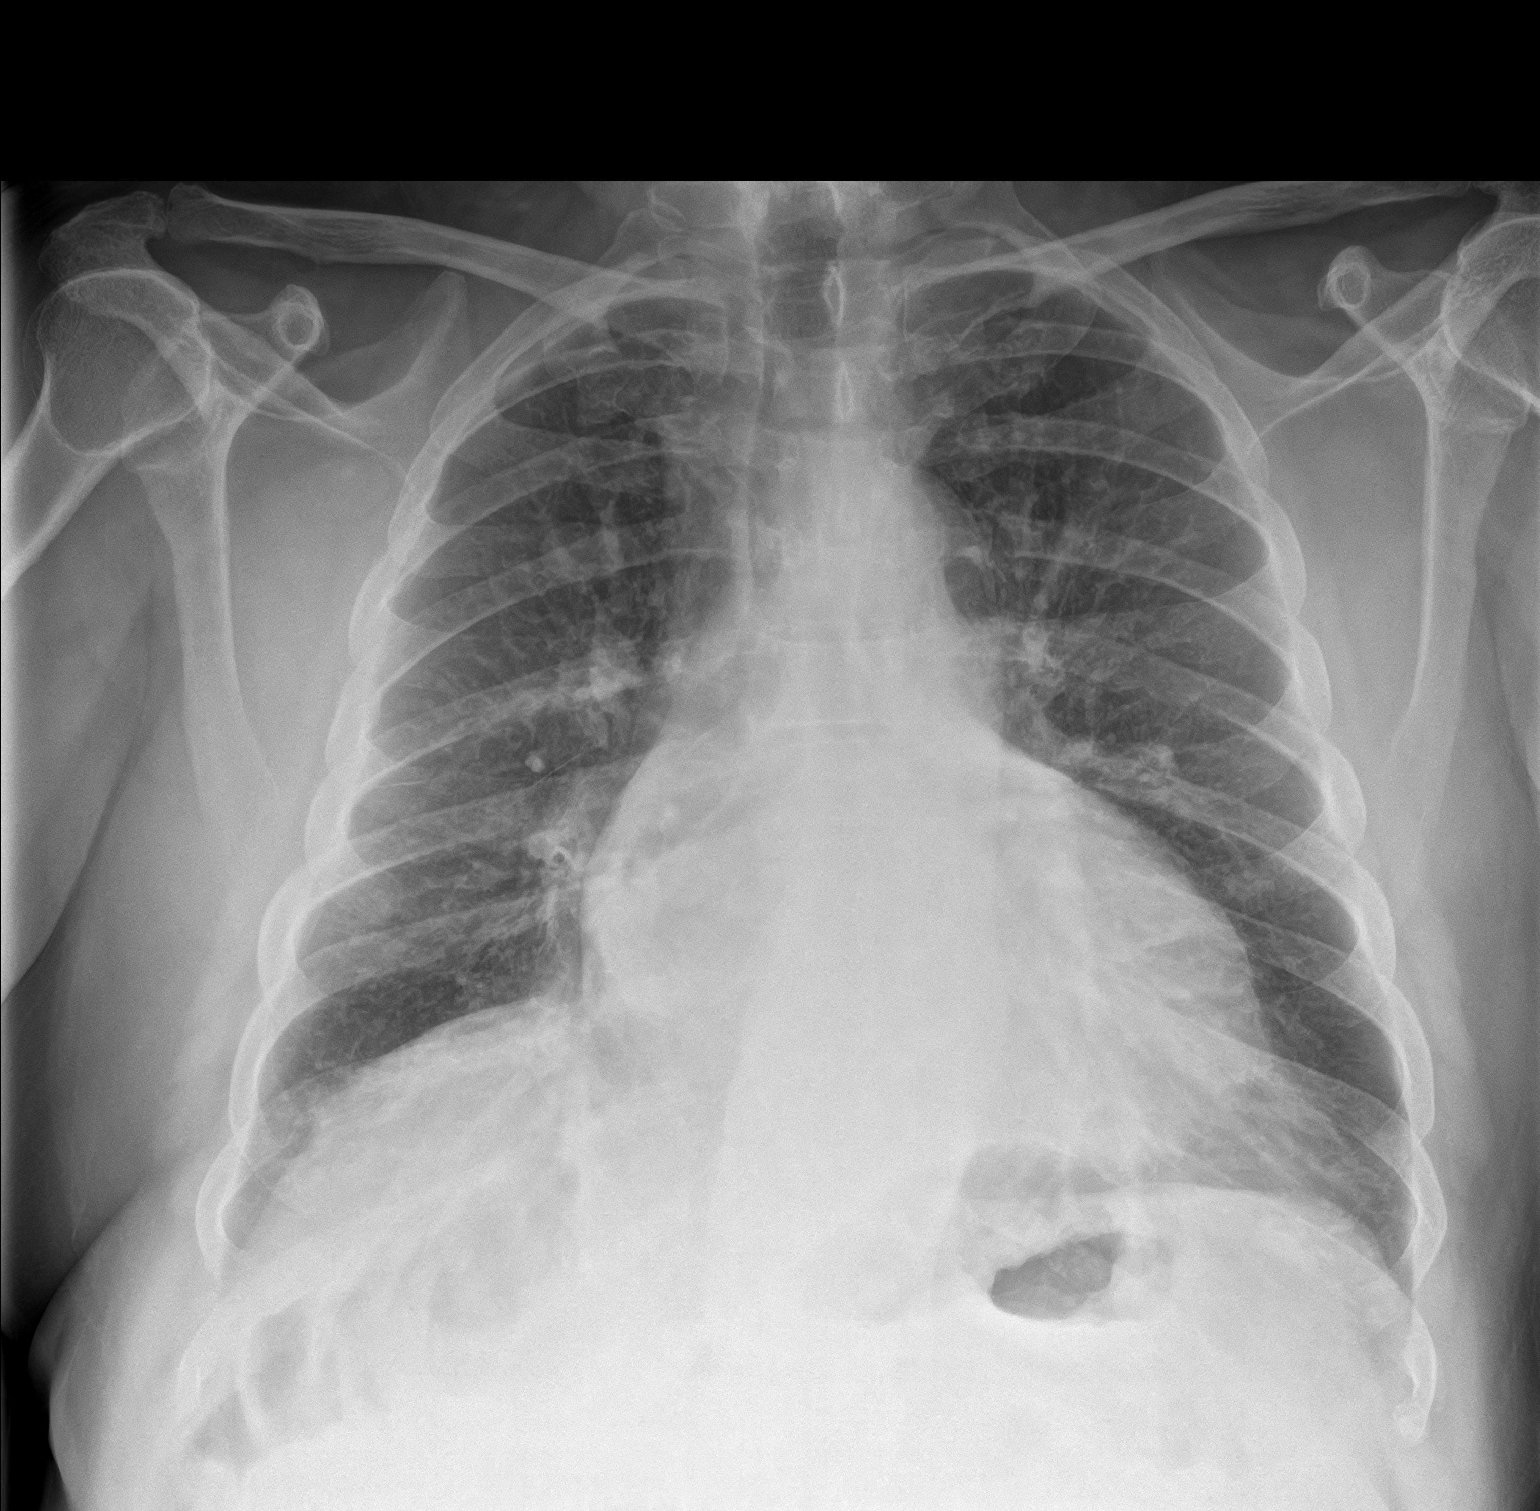
[im 2/2]
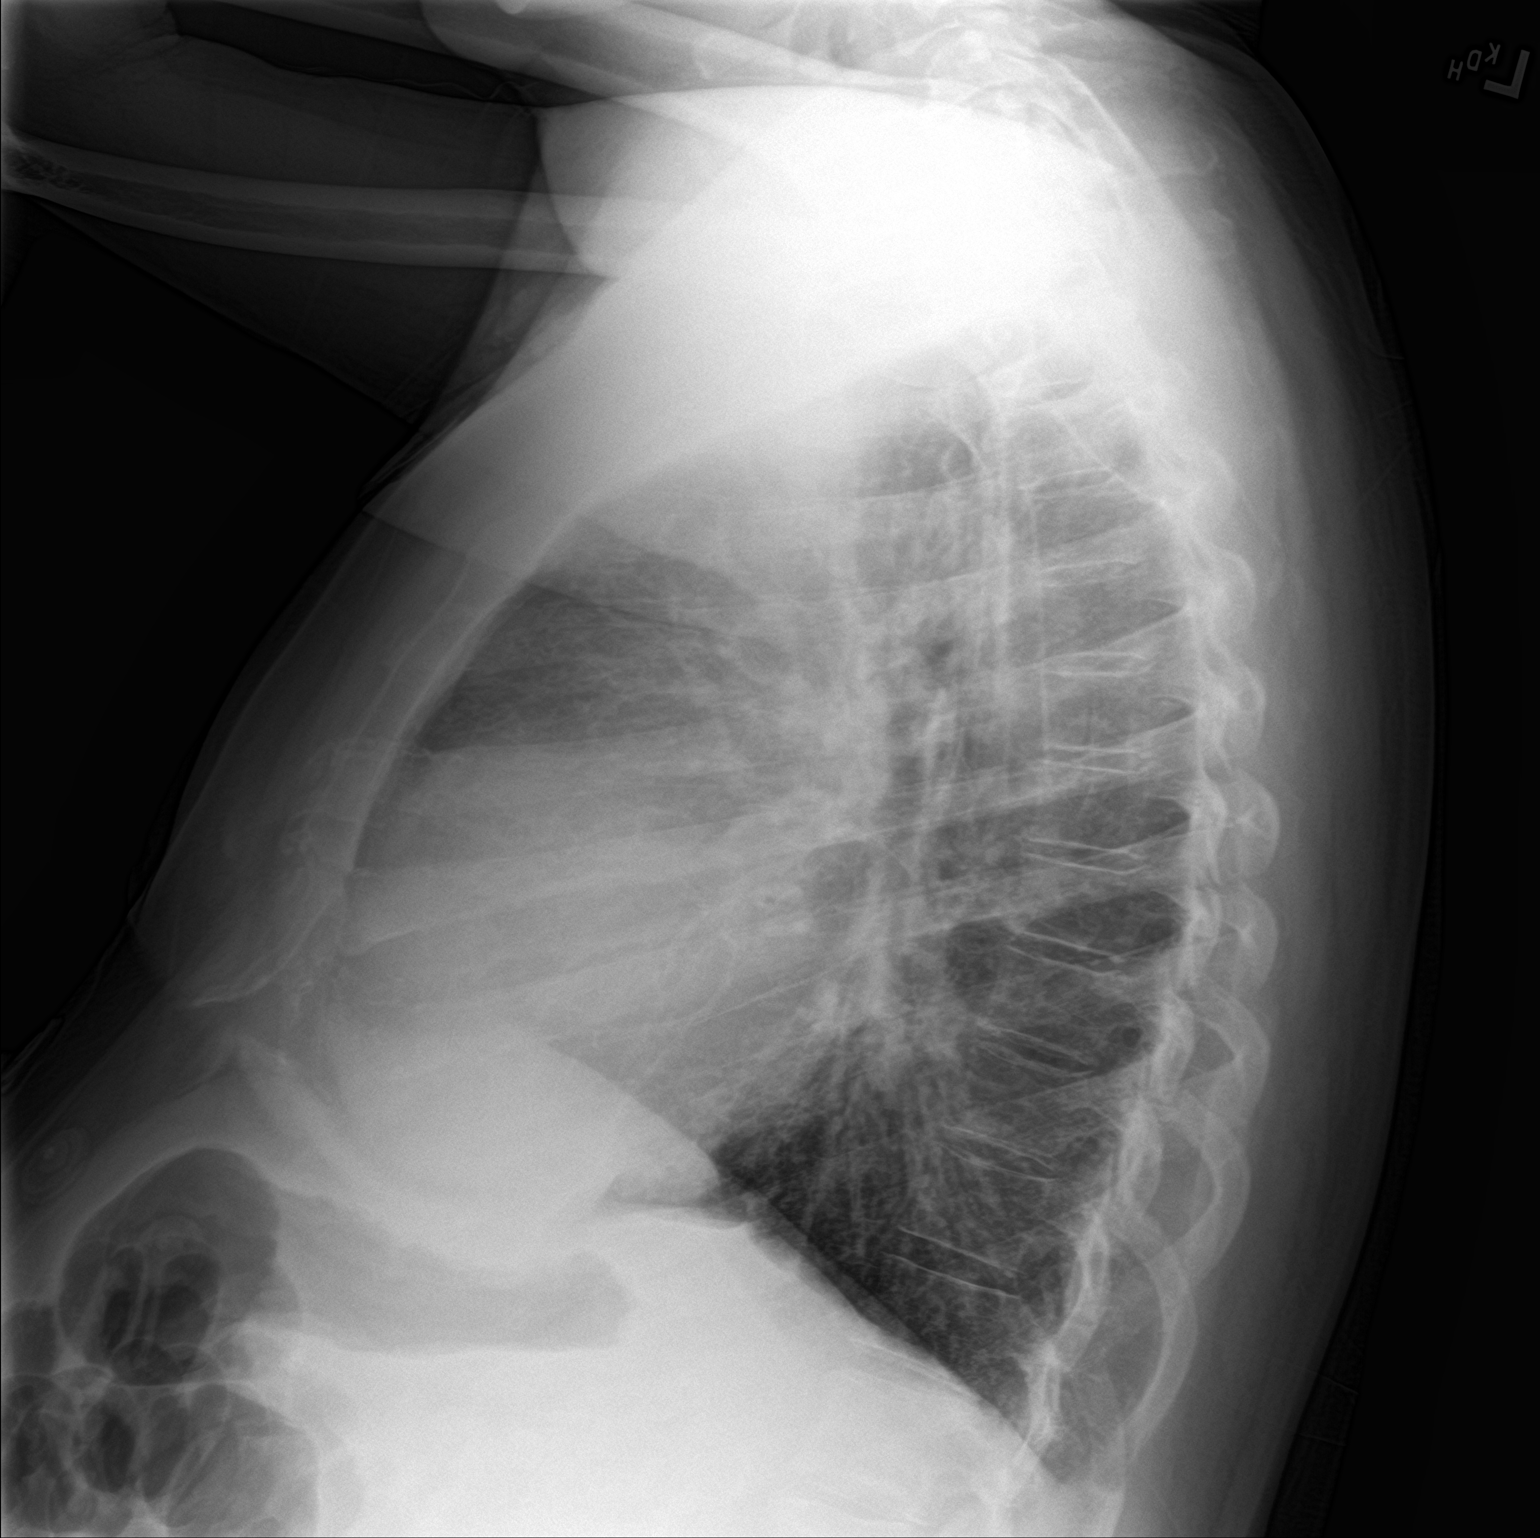

[2 of 2 positions shown; findings below may reference images not displayed]

FINDINGS: Cardiac enlargement. Negative for heart failure or edema. No
infiltrate or effusion identified. Mild pulmonary hyperinflation.
IMPRESSION: Cardiac enlargement without acute abnormality.
